# Patient Record
Sex: Female | Born: 1965 | Race: Black or African American | Hispanic: No | Marital: Single | Smoking: Never smoker
Health system: Southern US, Community
[De-identification: ages and names within clinical notes are randomized; demographics above are authoritative.]

## PROBLEM LIST (undated history)

## (undated) DIAGNOSIS — M1990 Unspecified osteoarthritis, unspecified site: Secondary | ICD-10-CM

## (undated) HISTORY — PX: INCISION AND DRAINAGE: SHX5863

---

## 1998-05-31 ENCOUNTER — Emergency Department (HOSPITAL_COMMUNITY): Admission: EM | Admit: 1998-05-31 | Discharge: 1998-05-31 | Payer: Self-pay | Admitting: Emergency Medicine

## 1998-06-11 ENCOUNTER — Emergency Department (HOSPITAL_COMMUNITY): Admission: EM | Admit: 1998-06-11 | Discharge: 1998-06-11 | Payer: Self-pay | Admitting: Emergency Medicine

## 2000-03-19 ENCOUNTER — Emergency Department (HOSPITAL_COMMUNITY): Admission: EM | Admit: 2000-03-19 | Discharge: 2000-03-20 | Payer: Self-pay | Admitting: Emergency Medicine

## 2000-03-19 ENCOUNTER — Encounter: Payer: Self-pay | Admitting: Emergency Medicine

## 2000-05-22 ENCOUNTER — Emergency Department (HOSPITAL_COMMUNITY): Admission: EM | Admit: 2000-05-22 | Discharge: 2000-05-22 | Payer: Self-pay | Admitting: Emergency Medicine

## 2001-04-12 ENCOUNTER — Inpatient Hospital Stay (HOSPITAL_COMMUNITY): Admission: EM | Admit: 2001-04-12 | Discharge: 2001-05-02 | Payer: Self-pay | Admitting: Emergency Medicine

## 2001-04-12 ENCOUNTER — Encounter: Payer: Self-pay | Admitting: *Deleted

## 2001-04-13 ENCOUNTER — Encounter: Payer: Self-pay | Admitting: Orthopedic Surgery

## 2001-04-15 ENCOUNTER — Encounter: Payer: Self-pay | Admitting: *Deleted

## 2001-04-17 ENCOUNTER — Encounter: Payer: Self-pay | Admitting: *Deleted

## 2001-04-18 ENCOUNTER — Encounter: Payer: Self-pay | Admitting: Infectious Diseases

## 2001-04-21 ENCOUNTER — Encounter: Payer: Self-pay | Admitting: Emergency Medicine

## 2001-04-22 ENCOUNTER — Encounter: Payer: Self-pay | Admitting: *Deleted

## 2001-04-22 ENCOUNTER — Encounter: Payer: Self-pay | Admitting: Family Medicine

## 2001-04-25 ENCOUNTER — Encounter: Payer: Self-pay | Admitting: Rheumatology

## 2001-05-02 ENCOUNTER — Inpatient Hospital Stay (HOSPITAL_COMMUNITY)
Admission: RE | Admit: 2001-05-02 | Discharge: 2001-06-26 | Payer: Self-pay | Admitting: Physical Medicine & Rehabilitation

## 2001-05-16 ENCOUNTER — Encounter: Payer: Self-pay | Admitting: Physical Medicine & Rehabilitation

## 2001-08-14 ENCOUNTER — Ambulatory Visit (HOSPITAL_COMMUNITY)
Admission: RE | Admit: 2001-08-14 | Discharge: 2001-08-14 | Payer: Self-pay | Admitting: Physical Medicine & Rehabilitation

## 2001-08-14 ENCOUNTER — Encounter: Payer: Self-pay | Admitting: Physical Medicine & Rehabilitation

## 2003-02-20 ENCOUNTER — Encounter: Payer: Self-pay | Admitting: Orthopedic Surgery

## 2003-02-20 ENCOUNTER — Ambulatory Visit: Admission: RE | Admit: 2003-02-20 | Discharge: 2003-02-20 | Payer: Self-pay | Admitting: Orthopedic Surgery

## 2003-02-27 ENCOUNTER — Encounter: Payer: Self-pay | Admitting: Internal Medicine

## 2003-02-27 ENCOUNTER — Encounter: Admission: RE | Admit: 2003-02-27 | Discharge: 2003-02-27 | Payer: Self-pay | Admitting: Internal Medicine

## 2003-03-25 ENCOUNTER — Ambulatory Visit (HOSPITAL_COMMUNITY): Admission: RE | Admit: 2003-03-25 | Discharge: 2003-03-25 | Payer: Self-pay | Admitting: *Deleted

## 2003-03-25 ENCOUNTER — Encounter (INDEPENDENT_AMBULATORY_CARE_PROVIDER_SITE_OTHER): Payer: Self-pay | Admitting: *Deleted

## 2003-03-25 ENCOUNTER — Encounter: Payer: Self-pay | Admitting: Internal Medicine

## 2003-04-17 ENCOUNTER — Ambulatory Visit (HOSPITAL_COMMUNITY): Admission: RE | Admit: 2003-04-17 | Discharge: 2003-04-17 | Payer: Self-pay | Admitting: Internal Medicine

## 2003-04-17 ENCOUNTER — Encounter: Payer: Self-pay | Admitting: Cardiology

## 2003-04-17 ENCOUNTER — Encounter: Payer: Self-pay | Admitting: Internal Medicine

## 2003-04-17 ENCOUNTER — Encounter (INDEPENDENT_AMBULATORY_CARE_PROVIDER_SITE_OTHER): Payer: Self-pay | Admitting: *Deleted

## 2003-05-04 ENCOUNTER — Encounter: Admission: RE | Admit: 2003-05-04 | Discharge: 2003-05-04 | Payer: Self-pay | Admitting: Rheumatology

## 2003-05-04 ENCOUNTER — Encounter: Payer: Self-pay | Admitting: Rheumatology

## 2003-06-16 ENCOUNTER — Encounter: Admission: RE | Admit: 2003-06-16 | Discharge: 2003-06-16 | Payer: Self-pay | Admitting: Rheumatology

## 2003-06-16 ENCOUNTER — Encounter: Payer: Self-pay | Admitting: Rheumatology

## 2003-09-02 ENCOUNTER — Encounter: Payer: Self-pay | Admitting: Internal Medicine

## 2003-09-02 ENCOUNTER — Encounter: Admission: RE | Admit: 2003-09-02 | Discharge: 2003-09-02 | Payer: Self-pay | Admitting: Internal Medicine

## 2003-09-07 ENCOUNTER — Ambulatory Visit (HOSPITAL_COMMUNITY): Admission: RE | Admit: 2003-09-07 | Discharge: 2003-09-07 | Payer: Self-pay | Admitting: Internal Medicine

## 2003-09-07 ENCOUNTER — Encounter (INDEPENDENT_AMBULATORY_CARE_PROVIDER_SITE_OTHER): Payer: Self-pay | Admitting: *Deleted

## 2003-09-07 ENCOUNTER — Encounter: Payer: Self-pay | Admitting: Internal Medicine

## 2003-09-11 ENCOUNTER — Inpatient Hospital Stay (HOSPITAL_COMMUNITY): Admission: EM | Admit: 2003-09-11 | Discharge: 2003-09-22 | Payer: Self-pay | Admitting: Internal Medicine

## 2003-09-11 ENCOUNTER — Encounter: Payer: Self-pay | Admitting: Internal Medicine

## 2003-09-13 ENCOUNTER — Encounter: Payer: Self-pay | Admitting: Thoracic Surgery

## 2003-09-14 ENCOUNTER — Encounter (INDEPENDENT_AMBULATORY_CARE_PROVIDER_SITE_OTHER): Payer: Self-pay | Admitting: Specialist

## 2003-09-14 ENCOUNTER — Encounter: Payer: Self-pay | Admitting: Thoracic Surgery

## 2003-09-15 ENCOUNTER — Encounter: Payer: Self-pay | Admitting: Thoracic Surgery

## 2003-09-16 ENCOUNTER — Encounter: Payer: Self-pay | Admitting: Thoracic Surgery

## 2003-09-17 ENCOUNTER — Encounter: Payer: Self-pay | Admitting: Thoracic Surgery

## 2003-09-18 ENCOUNTER — Encounter: Payer: Self-pay | Admitting: Thoracic Surgery

## 2003-09-19 ENCOUNTER — Encounter: Payer: Self-pay | Admitting: Thoracic Surgery

## 2003-09-20 ENCOUNTER — Encounter: Payer: Self-pay | Admitting: Thoracic Surgery

## 2003-09-21 ENCOUNTER — Encounter: Payer: Self-pay | Admitting: Thoracic Surgery

## 2003-10-06 ENCOUNTER — Encounter: Admission: RE | Admit: 2003-10-06 | Discharge: 2003-10-06 | Payer: Self-pay | Admitting: Thoracic Surgery

## 2003-10-20 ENCOUNTER — Encounter: Admission: RE | Admit: 2003-10-20 | Discharge: 2003-10-20 | Payer: Self-pay | Admitting: Thoracic Surgery

## 2003-10-26 ENCOUNTER — Emergency Department (HOSPITAL_COMMUNITY): Admission: EM | Admit: 2003-10-26 | Discharge: 2003-10-27 | Payer: Self-pay | Admitting: Emergency Medicine

## 2003-11-05 ENCOUNTER — Encounter: Admission: RE | Admit: 2003-11-05 | Discharge: 2003-11-05 | Payer: Self-pay | Admitting: Thoracic Surgery

## 2003-12-01 ENCOUNTER — Encounter: Admission: RE | Admit: 2003-12-01 | Discharge: 2003-12-01 | Payer: Self-pay | Admitting: Thoracic Surgery

## 2003-12-23 ENCOUNTER — Encounter: Admission: RE | Admit: 2003-12-23 | Discharge: 2003-12-23 | Payer: Self-pay | Admitting: Thoracic Surgery

## 2004-02-23 ENCOUNTER — Encounter: Admission: RE | Admit: 2004-02-23 | Discharge: 2004-02-23 | Payer: Self-pay | Admitting: Thoracic Surgery

## 2004-10-03 ENCOUNTER — Ambulatory Visit: Payer: Self-pay | Admitting: Physical Medicine & Rehabilitation

## 2004-10-03 ENCOUNTER — Inpatient Hospital Stay (HOSPITAL_COMMUNITY): Admission: RE | Admit: 2004-10-03 | Discharge: 2004-10-06 | Payer: Self-pay | Admitting: Orthopedic Surgery

## 2004-11-02 ENCOUNTER — Encounter: Admission: RE | Admit: 2004-11-02 | Discharge: 2005-01-31 | Payer: Self-pay | Admitting: Orthopedic Surgery

## 2005-05-27 ENCOUNTER — Emergency Department (HOSPITAL_COMMUNITY): Admission: EM | Admit: 2005-05-27 | Discharge: 2005-05-27 | Payer: Self-pay | Admitting: Emergency Medicine

## 2005-07-20 ENCOUNTER — Emergency Department (HOSPITAL_COMMUNITY): Admission: EM | Admit: 2005-07-20 | Discharge: 2005-07-20 | Payer: Self-pay | Admitting: Emergency Medicine

## 2005-07-24 ENCOUNTER — Emergency Department (HOSPITAL_COMMUNITY): Admission: EM | Admit: 2005-07-24 | Discharge: 2005-07-24 | Payer: Self-pay | Admitting: Emergency Medicine

## 2005-07-26 ENCOUNTER — Emergency Department (HOSPITAL_COMMUNITY): Admission: EM | Admit: 2005-07-26 | Discharge: 2005-07-26 | Payer: Self-pay | Admitting: Family Medicine

## 2005-12-14 IMAGING — CR DG CHEST 2V
2 series · 2 of 2 positions shown · non-contrast
Comparison: none

CLINICAL DATA: Empyema with fistula.
 TWO VIEW CHEST
 Compared to 12/23/03.
 Heart and mediastinum remain normal.  Small bilateral pleural effusions with slight interval decrease.  No definite air space disease.
 IMPRESSION
 Small bilateral pleural effusions with interval decrease.  Otherwise unchanged.

[view not recorded (1 of 2)]
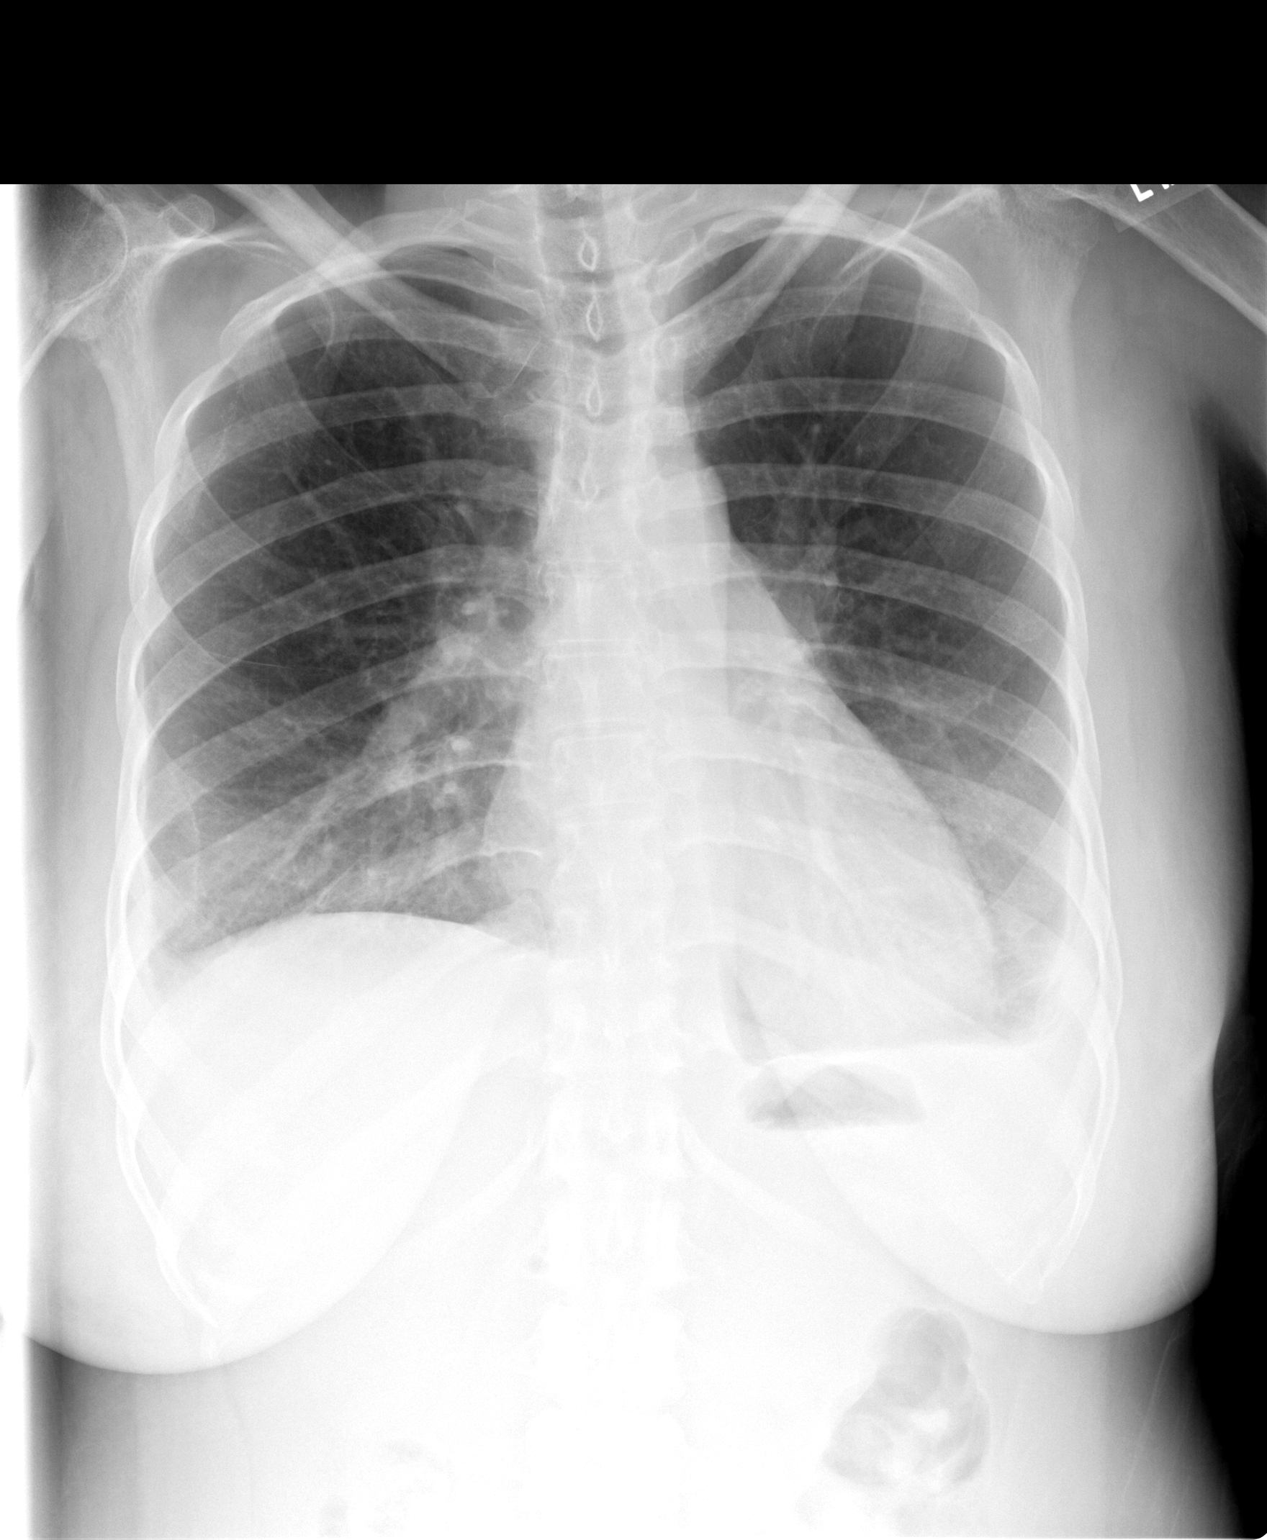

[view not recorded (2 of 2)]
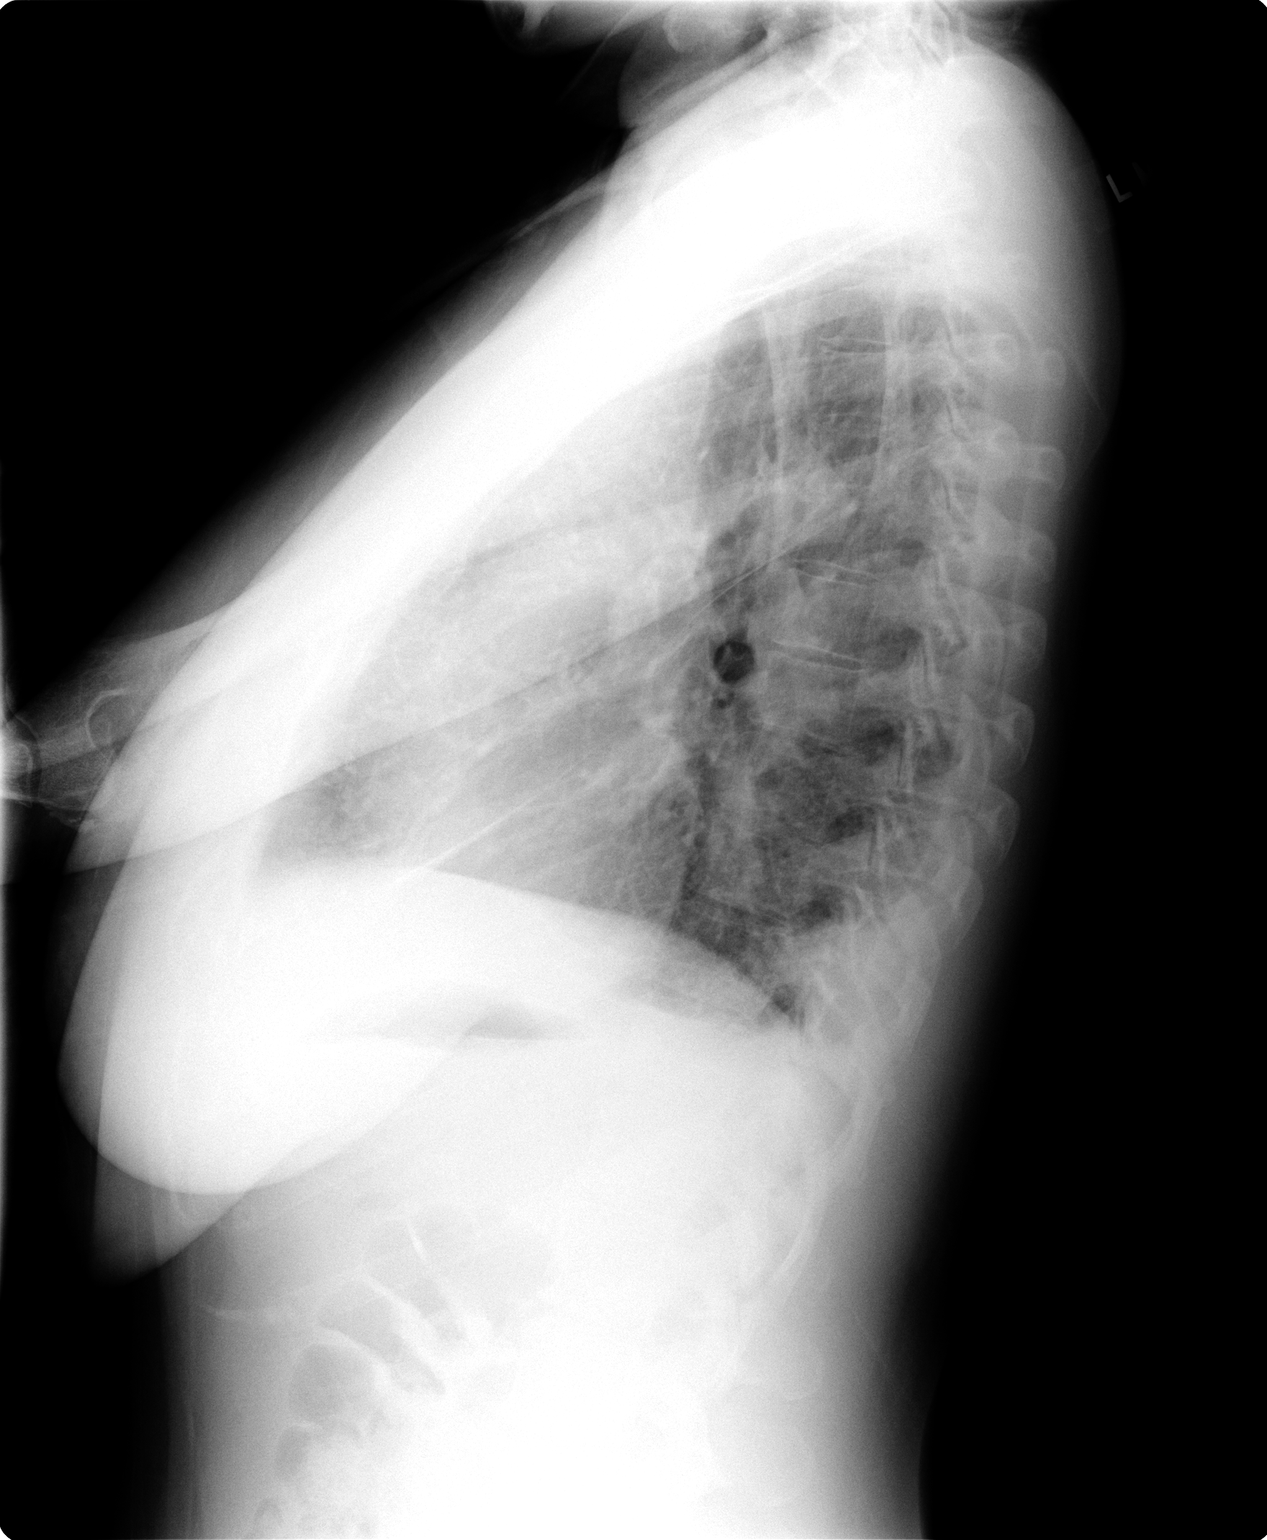

[2 of 2 positions shown; findings below may reference images not displayed]

## 2006-03-01 ENCOUNTER — Emergency Department (HOSPITAL_COMMUNITY): Admission: EM | Admit: 2006-03-01 | Discharge: 2006-03-01 | Payer: Self-pay | Admitting: Emergency Medicine

## 2006-05-24 ENCOUNTER — Encounter (INDEPENDENT_AMBULATORY_CARE_PROVIDER_SITE_OTHER): Payer: Self-pay | Admitting: *Deleted

## 2006-05-24 ENCOUNTER — Ambulatory Visit (HOSPITAL_COMMUNITY): Admission: RE | Admit: 2006-05-24 | Discharge: 2006-05-24 | Payer: Self-pay | Admitting: Obstetrics & Gynecology

## 2007-03-15 ENCOUNTER — Emergency Department (HOSPITAL_COMMUNITY): Admission: EM | Admit: 2007-03-15 | Discharge: 2007-03-15 | Payer: Self-pay | Admitting: Emergency Medicine

## 2010-12-17 ENCOUNTER — Encounter: Payer: Self-pay | Admitting: Thoracic Surgery

## 2010-12-19 ENCOUNTER — Encounter: Payer: Self-pay | Admitting: Obstetrics & Gynecology

## 2011-04-14 NOTE — H&P (Signed)
Kimberly Chavez, Kimberly Chavez                      ACCOUNT NO.:  [id number]   MEDICAL RECORD NO.:  [id number]                   PATIENT TYPE:  INP   LOCATION:  NA                                   FACILITY:  [facility]   PHYSICIAN:  Mila Homer. Sherlean Foot, M.D.              DATE OF BIRTH:  [birth year]   DATE OF ADMISSION:  02/23/2003  DATE OF DISCHARGE:                                HISTORY & PHYSICAL   CHIEF COMPLAINT:  Stiff and painful right knee.   HISTORY OF PRESENT ILLNESS:  The patient is a 45 year old black female with  a diagnosis of rheumatoid arthritis.  The patient states that approximately  three to four years ago, she started having progressively worsening  stiffness of her right knee. She would have some discomfort in her knee with  weightbearing, but her primary problem was stiffness.  The stiffness  progressed over time, and currently she is unable to bend her knee at all.  The knee is locked in a full extension position. The patient currently  denies any mechanical symptoms due to inability to move her knee.  She does  still have pain with weightbearing.  She does have occasional swelling.   X-rays revealed complete bone-on-bone and joint destruction of her right  knee.   ALLERGIES:  No known drug allergies   CURRENT MEDICATIONS:  Celebrex 200 mg p.o. daily.   PAST MEDICAL HISTORY:  1. Rheumatoid arthritis diagnosed approximately two years ago.  2. Significant multiple staph abscesses upper and lower extremities in 2002     requiring surgical debridement.   The patient denies any other significant medical issues, but she is not  currently being followed by any primary care or specialty physician other  than Dr. Sherlean Foot.   PAST SURGICAL HISTORY:  1. Multiple staph abscesses, left arm, right leg, and left buttocks in 2002.  2. Miscarriage D&C in 1998.   The patient denies any complications with the above-listed surgical  procedures.   SOCIAL HISTORY:  The patient is a  45 year old black female. She does have  significant external physical abnormalities correlating with rheumatoid  arthritis diagnosis.  She appears to be weight appropriate for her size and  age.  She denies any history of smoking or alcohol use.  She is single.  She  lives in a [place] house.  She is currently disabled due to her right  knee.   PRIMARY CARE PHYSICIAN:  The patient has not been followed by any primary  care physicians in the past.   FAMILY MEDICAL HISTORY:  Mother is deceased from unknown cancer.  Father is  deceased from a gunshot wound.  The patient has three brothers alive and in  good health, two sisters alive in good health.   REVIEW OF SYSTEMS:  The patient denies any complaints other than those  related to this current surgical or rheumatoid knee, specifically within the  general, sensory, respiratory, cardiac, GI,  GU, hematologic,  musculoskeletal, neurologic, mental status issues.   PHYSICAL EXAMINATION:  VITAL SIGNS:  Height is 5 feet even.  Weight 145  pounds.  Pulse 120 and regular, respirations 12, blood pressure 198/68.  SKIN:  Warm and dry.  GENERAL: This is an age-appropriate-appearing black female with obvious  upper and lower extremity deformities related to rheumatoid arthritis,  ambulates with cane in her right arm.  She ambulates with her right leg  locked in full extension position. She has difficulty getting onto the exam  table even with assistance.  HEENT:  Head is normocephalic and atraumatic.  Nontender over maxillary and  frontal sinuses.  Pupils are equal, round, and reactive.  Pupils appear to  be slightly exophthalmic.  Extraocular movements are intact.  Sclerae is not  icteric. Conjunctivae pink and moist.  External ears without deformity.  Canals patent.  TMs pearly grey and intact.  Gross hearing is intact.  Nasal  septum is midline.  Mucous membranes pink and moist.  Oral buccal mucosa  pink and moist without lesions.  Dentition  was in fair repair.  The patient  is able to swallow without any difficulty.  The patient did have diffuse  erythremic macular rash across the predominantly right side of lower face.  NECK:  Supple.  No palpable lymphadenopathy.  Thyroid gland nontender.  The  patient had good range of motion of her cervical spine without any  difficulty or tenderness.  CHEST:  Lung exam clear and equal bilaterally.  No wheezes, rales, rhonchi,  or rubs noted.  HEART:  Rapid, regular rate and rhythm.  S1 and S2 auscultated.  No murmurs,  rubs, or gallops noted.  ABDOMEN:  Flat, soft, nontender.  Bowel sounds normoactive throughout.  Unable to palpate any hepatosplenomegaly.  CVA region nontender.  UPPER EXTREMITIES:  Right and left shoulder were significantly limited in  range of motion.  The patient was unable to elevate her arms above 45  degrees in any direction, both right and left.  Her right elbow was locked  in about a 45 degree flexion.  Her left elbow had 10 degrees short of full  extension, flexion up to 130 degrees with approximately 45 degrees pronation  and supination from a neutral position.  Bilateral hands had diffuse  swelling and bogginess appearing about all digits.  She had moderate amount  of diffuse erythema around the palmar aspects of the digits and around the  fingernail creases.  She was unable to fully extend all of her digits, but  she was able to flex approximately about three-quarters of the way.  RIGHT AND LEFT HIP:  Full extension and flexion up to about 90 degrees with  about 15 degrees internal and external rotation without any discomfort.  Right knee had a very obvious high and prominent patellar and femoral  condyle region.  It was mechanically locked in full extension.  It was  painful with any attempts for flexion, and there was absolutely no movement anterior posteriorly, medially, laterally, or with flexion.  The left knee  had full extension and flexion back to 90  degrees.  She had no general  tenderness throughout, but it was boggy-like appearing.  She had about 5 to  10 degrees valgus varus laxity.  Calves were nontender.  The ankles had  fairly good dorsiflexion and plantar flexion.  PERIPHERAL VASCULATURE:  Carotid pulses 2+, no bruits.  Radial pulses were  1+, unable to palpate any posterior tibial. Dorsalis pedis pulses were  1+.  She had some swelling in the left ankle that was nonpitting, minimal  swelling in the right ankle.  She had no venostasis changes or varicosities.  NEUROLOGIC:  The patient was conscious, alert, and appropriate, held an easy  conversation with examiner. Cranial nerves II-XII grossly intact.  She was  grossly intact to light touch sensation from head to toe.  Unable to elicit  deep tendon reflexes on the right extremities due to deformities.  BREASTS/RECTAL/GU:  Exams deferred at this time.   IMPRESSION:  1. Severe rheumatologic deformities of the right upper and lower     extremities.  2. Rheumatoid arthritis.  3. History of multiple Staphylococcus abscesses surgically removed in 2002.   PLAN:  The patient is expected to be admitted to [facility] on  February 23, 2003, under the care of Mila Homer. Lucey, M.D., for planned right  total knee arthroplasty of significant complication due to her rheumatoid  arthritis and locked, fully extended knee.  Due to the patient's current  medical status, her rapid heart rate, blood pressure, her not being followed  by any primary care physician for any significant period of time, advanced  age, rheumatoid arthritis, previous staph infections, the patient will be  set up and evaluated for medical clearance prior to this procedure.  If we  do not receive medical clearance prior to this or extensive workup is  required, the patient will be cancelled and rescheduled when medically  appropriate. Once approved for surgery, the patient will undergo all the  routine labs and  tests prior to this procedure.  The patient is expected to  be a slow progress candidate due to her significant stages of rheumatoid  arthritis and weakness of her right lower extremity.     Jamelle Rushing, P.A.                      Mila Homer. Sherlean Foot, M.D.    RWK/MEDQ  D:  02/17/2003  T:  02/17/2003  Job:  [id number]

## 2011-04-14 NOTE — Op Note (Signed)
Kimberly Chavez, Kimberly Chavez                      ACCOUNT NO.:  [id number]   MEDICAL RECORD NO.:  [id number]                   PATIENT TYPE:  OIB   LOCATION:  2854                                 FACILITY:  [facility]   PHYSICIAN:  Ines Bloomer, M.D.              DATE OF BIRTH:  1966-01-13   DATE OF PROCEDURE:  09/14/2003  DATE OF DISCHARGE:                                 OPERATIVE REPORT   PREOPERATIVE DIAGNOSIS:  Empyema, right chest.   POSTOPERATIVE DIAGNOSES:  1. Chronic empyema right chest.  2. Polyarthritis.  3. Possible lupus with immunosuppression.   OPERATION:  Attempted right VATS, right thoracotomy. Drainage of empyema  with decortication.   SURGEON:  Ines Bloomer, M.D.   FIRST ASSISTANT:  Coral Ceo, P.A.-C.   ANESTHESIA:  General endotracheal anesthesia.   DESCRIPTION OF PROCEDURE:  After percutaneous insertion of all monitor lines  the patient underwent general anesthesia and was turned to the right lateral  thoracotomy position. This patient had had multiple thoracenteses of a right  pleural effusion and had cultured out Staph. A CT scan showed a thickened  pleura and empyema was suspected. She was brought to the operating room for  drainage. She was on chronic immunosuppression with prednisone and Imuran  for polyarthritis. She was also suspected to have systemic lupus  erythematosus.   After prepping and draping of the right chest, 2 trocar sites were made in  the 8th intercostal space. When we got there it was unfortunately at the  diaphragm and we could not really get into the empyema spaces, so this was  closed and another incision was made at the 7th intercostal space at the mid  axillary line. This did go into the empyema cavity and this was evacuated  and sent for culture and exudate was sent for culture.   The patient had a chronic empyema cavity and for this reason a  posterolateral thoracotomy was made over the 5th intercostal space. The  latissimus was divided and the serratus was reflected anteriorly. The 5th  intercostal space was entered. The superior  portion of the 5th intercostal  space was the upper  portion of the empyema. The pleura was taken off the  chest wall doing an extra pleural resection and dissecting down, getting  into the right upper lobe. The right upper lobe was then dissected off the  chest wall with sharp dissection. This was dissected superiorly from the  posterior segment of the right upper lobe to the anterior segment of the  right upper lobe.   The empyema cavity was then dissected medially, dissecting off the  mediastinum and the pericardium, and then dissecting down to the diaphragm  and dissecting off the diaphragm. Then attention was turned to the superior  portion of the empyema cavity and dissection was carried down to the  thickened peel to the lung and then the thickened peel was dissected off  with  blunt and sharp dissection using Kitners to remove it. We first removed  it along the fissure and removed it off the upper lobe and then removed it  off the superior  segment of the lower lobe.   When we came down to the minor fissure, the right middle lobe was completely  encapsulated and the thickened peel was dissected off the latter fissure,  freeing up the right upper lobe and then dissecting off the major fissure,  and finally the peel was dissected off the right middle lobe, completely  freeing it up.   Attention was turned the basilar segments of the right lower lobe and the  peel was dissected off the right basilar segment, dissecting down into  costophrenic angle. There was a thickened peel in the costophrenic angle and  this parietal peel was dissected off the chest wall.   After this had been done and all the peel had been removed, the lung was  reexpanded. There was an area of tear in the lung that was sutured in the  right upper lobe with 3-0 Vicryl in an interrupted  fashion. An area of the  basilar segment of the right upper lobe where there was a tear was resected  with the EZ 45 stapler, and a smaller area in the upper lobe was also  resected with the EZ 45 stapler.   A stab wound was made posteriorly  and a posterior  chest tube was brought  in and tied in place with 0 silk. In the area where we had done the original  trocar site in the 7th intercostal space, a right-angle and a straight chest  tube were placed through the patient's chest cavity and sutured in place  with 0 silk.   A Marcaine block was done in the usual fashion. The chest was closed with 3  pericostals of #1 Vicryl in the muscle layer. An on cue was placed in the  usual fashion, then 2-0 Vicryl in the subcutaneous tissue and Ethicon skin  clips. The patient was returned to the recovery room in serious condition                                                 Ines Bloomer, M.D.    DPB/MEDQ  D:  09/14/2003  T:  09/14/2003  Job:  [id number]   cc:   Joni Fears D. Young, M.D.  [address] [place]  [place] [address]  Fax: [phone]

## 2011-04-14 NOTE — Discharge Summary (Signed)
Kimberly Chavez, Kimberly Chavez NO.:  [id number]   MEDICAL RECORD NO.:  [id number]          PATIENT TYPE:  INP   LOCATION:  0452                         FACILITY:  [facility]   PHYSICIAN:  Ollen Gross, M.D.    DATE OF BIRTH:  [birth year]   DATE OF ADMISSION:  10/03/2004  DATE OF DISCHARGE:  10/06/2004                                 DISCHARGE SUMMARY   ADMITTING DIAGNOSES:  1.  End-stage arthritis, right knee.  2.  History of lupus.  3.  History of staphylococcal empyema of chest.   DISCHARGE DIAGNOSES:  1.  Rheumatoid arthritis right knee status post right total knee      arthroplasty.  2.  Mild postoperative blood loss anemia, did not require transfusion.  3.  Mild postoperative hyponatremia, improving at time of discharge.  4.  History of lupus.  5.  History of staphylococcal empyema of chest.   PROCEDURE:  Date of surgery October 03, 2004 - right total knee  arthroplasty.  Surgeon:  Dr. Homero Fellers Aluisio.  Assistant:  Avel Peace, P.A.-  C.  Anesthesia:  General.  Postoperative Marcaine pain pump.  Minimal blood  loss.  Hemovac drain x1.  Tourniquet time 65 minutes at 300 mmHg.   CONSULTS:  Rehab services.   BRIEF HISTORY:  Kimberly Chavez is a 45 year old female with severe rheumatoid  arthritis gone on to develop end-stage arthritis.  Radiographically she is  not fused, but clinically she is fused about 10 degrees and cannot move the  knee, intractable pain, and now presents for total knee arthroplasty.   LABORATORY DATA:  CBC:  Hemoglobin preoperative 12.4, hematocrit of 38.2,  white cell count 5.3, differential normal.  Postoperative H&H 9.9 and 29.8.  Last noted H&H 9.2 and 27.7.  PT/PTT preoperatively 14.3 and 37 respectively  with an INR of 1.2.  Serial protimes followed.  Last noted PT/INR 22.7 and  2.6.  Chem panel on admission:  Slightly elevated total protein of 9.1;  remaining chem panel within normal limits.  Serial BMETs are followed.  Sodium did drop from 139  to 126, was back up to 127.  Chloride dropped from  107 down to 94, last noted at 93.  Glucose went up from 83 to 151, back down  to 120.  Urinalysis:  Trace leukocyte esterase with many epithelial cells;  however, only 0-2 white cells, few bacteria.  Blood group and type O  positive.  EKG dated September 14, 2003:  Sinus tachycardia; since previous  tracing anterior T waves changed, improved.  Confirmed by Dr. Viann Fish, Jr.  Repeat EKG September 27, 2004:  Normal sinus rhythm, nonspecific  ST abnormality.  Heart rate decreased since previous tracing, from Dr. Olga Millers.   HOSPITAL COURSE:  The patient admitted to [facility], taken to the  OR, underwent the above-stated procedure without complication.  The patient  tolerated the procedure well and later transferred to the recovery room and  then to the orthopedic floor to continue postoperative care.  Vital signs  followed.  The patient had good pain control on postoperative day #1.  The  IV was Hep-Locked.  She had a Marcaine pain pump.  Started getting up with  physical therapy by day #2.  The knee was feeling a little bit better.  She  did have some asymptomatic tachycardia.  Denied any chest pain, shortness of  breath. She did undergo a rehab consult.  She was seen by rehab services and  felt that she would be an appropriate candidate for inpatient rehab.  Dressings changes initiated on postoperative day #2.  Incision was healing  well and the pain pump drain was removed, PCA and IV's were discontinued  along with the Foley.  Started getting up a little bit better and started  progressive therapy.  By day #3 she was feeling much better.  She was  progressing well with her physical therapy.  She did have a little bit of  elevated temperature on the evening of postoperative day #2 but she was back  afebrile by the morning of day #3.  Her hemoglobin was stable, she was  asymptomatic.  Sodium was a little low but had  started to trend upward.  She  was progressing well and decided that she would want to go home.  Therefore,  rehab was cancelled.  She worked with therapy, was doing well, and was  discharged home later that day.   DISCHARGE PLANNING:  1.  The patient was discharged home on October 06, 2004.  2.  Discharge diagnoses:  Please see above.  3.  Discharge medications:  Percocet, Robaxin, Coumadin.  4.  Diet:  Regular diet.  5.  Activity:  Total knee protocol, weightbearing as tolerated right lower      extremity, gait training ambulation and ADLs, home health PT and home      health nursing.  6.  Follow-up:  Two weeks from surgery.   DISPOSITION:  Home.   CONDITION UPON DISCHARGE:  Improved.     Alex   ALP/MEDQ  D:  11/23/2004  T:  11/23/2004  Job:  [id number]

## 2011-04-14 NOTE — H&P (Signed)
Kimberly, Chavez NO.:  [id number]   MEDICAL RECORD NO.:  [id number]          PATIENT TYPE:  INP   LOCATION:  0452                         [facility]ACILITY:  [facility]   PHYSICIAN:  Ollen Gross, M.D.    DATE OF BIRTH:  1966-03-15   DATE OF ADMISSION:  10/03/2004  DATE OF DISCHARGE:  10/06/2004                                HISTORY & PHYSICAL   CHIEF COMPLAINT:  Right knee pain.   HISTORY OF PRESENT ILLNESS:  The patient is a 46 year old female with a  longstanding history of discomfort concerning her right knee which has  progressively gotten worse over the past several months.  She has seen Dr.  Lequita Halt and has had injections in the past.  She was told that sometimes she  would need to have a knee replacement.  She ended up developing a pleural  effusion and could not have the procedure done a while back.  Unfortunately,  she has gone on to develop essentially a fused knee with progressive pain.  She has had a very difficult time getting around.  She is seen in the office  by Dr. Lequita Halt, found to have severe end-stage arthritis in the right knee  with bone-on-bone and notes joint space at all in the knee essentially  almost fused to the point where she has end-stage arthritis with a near  solid fusion.  She is having significant pain.  It is felt she would benefit  from having a takedown of this arthritis infusion and having placement of a  right total knee.  Risks and benefits have been discussed with the patient,  and she elects to proceed with surgery.   ALLERGIES:  No known drug allergies.   CURRENT MEDICATIONS:  Prednisone 5 mg.   PAST MEDICAL HISTORY:  1.  Lupus.  2.  History of a Staphylococcus empyema of the chest back on October 2004.   PAST SURGICAL HISTORY:  1.  I&D of the left hip and right calf in 2001.  2.  Chest tubes with drainage of empyema in 2004.   SOCIAL HISTORY:  Single, disabled, nonsmoker, no alcohol, no children.   FAMILY HISTORY:   Father deceased secondary to a gunshot wound.  Mother  deceased age 77 with a history of cancer.   REVIEW OF SYSTEMS:  GENERAL:  No fevers, chills, or night sweats.  NEUROLOGIC:  No seizures, syncope, paralysis.  RESPIRATORY:  No shortness of  breath, productive cough, or hemoptysis.  CARDIOVASCULAR:  No chest pain,  angina, or orthopnea.  GI:  No nausea, vomiting, diarrhea, or constipation.  GU:  No dysuria, hematuria, or discharge.  MUSCULOSKELETAL:  Pertinent to  the knee found in the history of present illness.   PHYSICAL EXAMINATION:  VITAL SIGNS:  Pulse 76, respirations 14, blood  pressure 120/82.  GENERAL:  A 45 year old African-American female, well-nourished, well-  developed, in no acute distress.  Alert, oriented, and cooperative, very  pleasant.  HEENT:  Normocephalic, atraumatic.  Pupils are round and reactive.  Ears  clear.  EOMs are intact.  NECK:  Supple.  No carotid bruits.  CHEST:  Clear  anterior, posterior chest walls.  HEART:  Regular rate and rhythm.  No murmurs.  S1, S2 noted.  ABDOMEN:  Soft, nontender.  Bowel sounds present.  RECTAL/BREASTS/GENITALIA:  Not done.  Not pertinent to present illness.  EXTREMITIES:  Right knee, essentially fused in full extension.  No flexion,  no instability problems.  Pulse is noted to be intact.   IMPRESSION:  1.  End-stage arthritis, right knee.  2.  History of lupus.  3.  History of Staphylococcus empyema of the chest.   PLAN:  The patient will be admitted to [facility] to undergo a  right total knee arthroplasty.  The surgery will be performed by Dr.  Lequita Halt.     Alex   ALP/MEDQ  D:  10/12/2004  T:  10/13/2004  Job:  [id number]   cc:   Aundra Dubin, M.D.

## 2011-04-14 NOTE — H&P (Signed)
[facility]  Patient:    Kimberly Chavez, Kimberly Chavez                   MRN: [id number] Adm. Date:  [id number] Attending:  Heather Roberts                         History and Physical  CHIEF COMPLAINT:  Pain and swelling.  HISTORY OF PRESENT ILLNESS:  Kimberly Chavez is a 45 year old black single female, currently seeking disability for disabling rheumatoid arthritis, on prednisone 5 mg b.i.d. since summer 2001, with an eight-month history of arthralgias and joint stiffness and intermittent right knee swelling, now with one week of right knee swelling persistent, associated with arthralgias and stiffness, one day of right shoulder stiffness and increased pain and swelling of her arms associated with erythema, and swelling of her right calf.  SOCIAL HISTORY:  Lives with her sister.  Siblings and father nearby.  She is currently unemployed.  MEDICATIONS: 1. Prednisone 5 mg b.i.d. 2. Aleve.  PAST MEDICAL HISTORY:  July 2001, DX rheumatoid arthritis, followed previously by rheumatologist.  FAMILY HISTORY:  Mother deceased -- lung sarcoma.  Three brothers and two sisters alive and well.  Father -- no health problems.  Specifically denied family history of cardiac, diabetes, thyroid.  REVIEW OF SYSTEMS:  HEENT:  No current allergies.  CARDIAC:  No chest pain. PULMONARY:  No current cough, shortness of breath.  GI:  No indigestion.  PHYSICAL EXAMINATION:  GENERAL:  Physical exam reveals a slender black female, lying, with some discomfort with any movement.  VITAL SIGNS:  Blood pressure 105/52.  Pulse is 120 and regular.  Respiratory rate 24.  Temperature 97.  HEENT:  Fundi grossly normal.  Pharynx not injected.  TMs:  Fluid.  NECK:  No thyromegaly.  CARDIAC:  Tachycardia.  Soft systolic murmur.  LUNGS:  Clear to A&P.  ABDOMEN:  Soft.  Spleen and liver are not felt.  RECTAL:  Deferred.  PELVIC:  Deferred.  EXTREMITIES:  Remarkable for swelling of her  right shoulder, bilateral forearm swelling and nodules with erythema just proximal to the left wrist.  She has swelling of her right knee, with swelling of the calf probably related to the knee.  LABORATORY DATA:  Hemoglobin 9.1, WBC 17,000.  IMPRESSION: 1. Flare of rheumatoid arthritis with nodules and arthralgias. 2. Probable Bakers cyst.  PLAN:  Physical therapy and rheumatology consult if available.  Higher doses of prednisone. D:  04/12/01 TD:  04/14/01 Job: [id number]

## 2011-04-14 NOTE — Op Note (Signed)
[facility]  Patient:    Kimberly Chavez, Kimberly Chavez                   MRN: [id number] Proc. Date: 04/13/01 Adm. Date:  [id number] Attending:  Heather Roberts CC:         Heather Roberts, M.D.   Operative Report  PREOPERATIVE DIAGNOSIS:       Abscesses left wrist, forearm, and arm.  POSTOPERATIVE DIAGNOSIS:      Abscesses left wrist, forearm, and arm.  OPERATION:                    Incision and drainage left wrist, left forearm,                               and left upper arm.  SURGEON:                      Lowell Bouton, M.D.  ASSISTANT:  ANESTHESIA:                   General anesthesia.  OPERATIVE FINDINGS:           The patient had gross purulent material in the subcutaneous tissues over the dorsum of her left wrist into her left forearm and into the upper arm around the biceps.  The abscess extended up into the axilla onto the left.  There was approximately 100 cc of purulent material total.  There was necrosis of one of the extensor tendons in the forearm and significant amount of subcutaneous fat necrosis.  DESCRIPTION OF PROCEDURE:     Under general anesthesia, the left arm was prepped and draped in the usual fashion and an I&D was performed longitudinally over the dorsum of the left wrist.  Gross purulent material was obtained and cultures were taken for aerobes, anaerobes, fungus and acid-fast bacteria.  Blunt dissection was done along the extensor tendons and the pus seemed to track along the extensor compartment of her forearm.  A longitudinal incision was then made in the mid to proximal forearm dorsally.  Gross purulent material was obtained and by blunt dissection, the subcutaneous tissues were drained.  A longitudinal incision was then made over the anterior aspect of the biceps and the upper arm.  Purulent material shot out of the wound and there was a large amount of pus that was drained around the biceps. Blunt  dissection was carried up into the axilla with finger dissection bringing more purulent material down along the biceps.  A pulse lavage irrigator was used and the three wounds were irrigated out.  Rongeur was used to remove necrotic subcutaneous fat.  In the forearm wound, there was a necrotic extensor tendon that was debrided.  It appeared to be one of the digital extensors.  Antibiotic irrigation was used to irrigate out the wounds. They were packed with two inch iodoform packing and closed with two sutures in each wound to reapproximate the space.  Sterile dressings were applied. The patient was sent to the recovery room awake and stable in good condition. DD:  04/13/01 TD:  04/15/01 Job: [id number]

## 2011-04-14 NOTE — H&P (Signed)
Kimberly Chavez, Kimberly Chavez                      ACCOUNT NO.:  [id number]   MEDICAL RECORD NO.:  [id number]                   PATIENT TYPE:  INP   LOCATION:  0455                                 FACILITY:  [facility]   PHYSICIAN:  Clinton D. Maple Hudson, M.D.              DATE OF BIRTH:  [birth year]   DATE OF ADMISSION:  09/11/2003  DATE OF DISCHARGE:                                HISTORY & PHYSICAL   ADMITTING DIAGNOSES:  1. Empyema, Staphylococcus aureus, broadly sensitive.  2. Systemic lupus.  3. Debilitating chronic arthritis.   HISTORY OF PRESENT ILLNESS:  This is a 45 year old African-American woman  immunosuppressed by medications for lupus being admitted to manage an  asymptomatic methicillin-resistant Staphylococcus aureus empyema.  She had  been hospitalized in June 2002 at [facility] with multiple soft tissue  staphylococcal abscesses on her left arm and right leg, etiology unknown to  her.  She was referred to me with pleural effusion in the Spring of 2004.  This was tapped under ultrasound March 25, 2003 with lab reporting only a  few wbc's and a clotted specimen with total protein of 6.5, LDH of 1030  consistent with an exudate.  All cultures were negative including routine  AFB and fungal.  Cytology showed some atypical cells, can't exclude CA and  inflammation.  She was tapped again for reaccumulation on Apr 17, 2003 but I  think not cultured.  Cytology showed acute and chronic inflammation.  Both  of these specimens were from the left chest.  She was referred back to me in  early October at which time she weighed 129 pounds.  Chest x-ray showed a  substantial increase in right-sided pleural effusion, persistent smaller  left effusion, and she was breathless.  She was tapped again under  ultrasound on September 07, 2003 still leaving residual fluid on the right and  with CT at that time demonstrating effusions, a right lower lobe density  with rounded contour, atelectasis versus  pneumonia versus mass.  Laboratory  from the September 07, 2003 sample returned today with white blood count  reported 346,500 of which 64% were PMN's.  Culture grew abundant  Staphylococcus aureus resistant only to penicillin.  Cytology reported acute  inflammation.  Intracellular and/or extracellular organisms were present on  visual examination of the fluid.  Earlier this summer she had been seen by  Dr. Kellie Simmering for rheumatology.  There had been previous rheumatologic  examination with impression of possible rheumatoid arthritis 2 or 3 years  ago.  Dr. Kellie Simmering felt this was more consistent with lupus noting alopecia,  leukopenia in the range of 1500 wbc's, pleural effusions, and her arthritis.  ANA this summer was positive at 1:1280 with anti-DNA positive 1179,  sedimentation rate was 94 and rheumatoid factor was 64.  He started her on  prednisone 20 mg daily on May 18, 2003.  For a while this was dropped to 10  mg  daily but more recently is back up to 20 mg daily.  She was started on  Plaquenil May 18, 2003 ended on July 27, 2003.  She was started on Imuran  75 mg daily on July 27, 2003.  When I got her thoracentesis labs today, I  called her in for admission planning chest tube drainage and antibiotics.   PAST MEDICAL HISTORY:  Disabling severe polyarthritis apparently due to  lupus.  Staphylococcus soft tissue infections of arm and leg in 2002.  Bilateral pleural effusions 2004 as per HPI.  She has been pending right  total knee replacement by Dr. Lequita Halt, on hold because of pleural effusions.  She has had no other surgeries.  An echocardiogram on Apr 17, 2003 showed  normal left ventricle, normal right atrium and right ventricle with ejection  fraction 55-65%.  CT chest at the [facility] on February 27, 2003 was negative for PE  but showed bilateral effusions and atelectasis with progressive airspace  disease on the right, atelectasis versus pneumonia versus mass.  Thoracentesis x3 in 2004  as per HPI showing some atypia and acute and  chronic inflammation.  She denies any history of diabetes, HIV exposure, TB  exposure, DVT, or pulmonary embolism.   REVIEW OF SYSTEMS:  She know denies any pain including headache or joint  pain, any fever or chills, any bleeding or any adenopathy.  There is little  cough and scant white sputum; no palpitation, no problem with bowels or  bladder.   SOCIAL HISTORY:  Disabled by her arthritis; single; had been living with  brother; no children; never smoked; no alcohol; denies unprotected sex  (abstinent for 2 years), any transfusion, any HIV exposure, or any use of  street drugs.   FAMILY HISTORY:  Details not available - to be completed later.   EXAMINATION:  VITAL SIGNS:  Pulse regular about 100, respirations about 24  on room air.  GENERAL APPEARANCE:  Thin alert woman oriented with clear speech.  Significant contractures due to arthritis.  SKIN:  Depigmented scars left arm.  Mild alopecia.  No obvious acute rash.  ADENOPATHY:  None found.  HEENT:  Has own teeth.  Mucosa seems clear.  Speech and hearing seem normal.  There is no JVD or stridor.  CHEST:  Dull to percussion right base but otherwise clear.  HEART:  Regular rhythm.  No murmur, gallop, or rub is audible.  BREASTS:  No dominant mass.  ABDOMEN:  Soft without hepatosplenomegaly felt through clothing.  PELVIC AND RECTAL:  Not done.  EXTREMITIES:  Arthritic contractures.  Brace right knee.  No cords,  effusion, cyanosis, or clubbing.   IMPRESSION:  1. Right-side staphylococcal empyema culture positive for broadly sensitive     Staphylococcus aureus as of September 07, 2003 in an immunosuppressed     patient.  I am starting Ancef intravenously given her lack of acute     systemic complaints pending of infectious diseases and cardiovascular     thoracic surgery consults.  Chest x-ray tonight to confirm current status     of effusion, planning chest tube drainage. 2. Apparent  primary illness is systemic lupus with complications noted     above.  We will continue Imuran and prednisone as per outpatient     management for now.  3. Debilitating chronic arthritis with total knee replacement on hold.  Note     that she consents to HIV assay after informed discussion.  Clinton D. Maple Hudson, M.D.    CDY/MEDQ  D:  09/11/2003  T:  09/11/2003  Job:  [id number]   cc:   Ralene Ok, M.D.  [address]  [place]  [place] [address]  Fax: [phone]   Ollen Gross, M.D.  [address]  [place]  [place] [address]  Fax: [phone]   Aundra Dubin, M.D.   Lacretia Leigh. Ninetta Lights, M.D.  [address]  [place]  [place] [address]  Fax: [phone]

## 2011-04-14 NOTE — Op Note (Signed)
NAMEAJAH, VANHOOSE            ACCOUNT NO.:  [id number]   MEDICAL RECORD NO.:  [id number]          PATIENT TYPE:  AMB   LOCATION:  SDC                           FACILITY:  [facility]   PHYSICIAN:  Roseanna Rainbow, M.D.DATE OF BIRTH:  [birth year]   DATE OF PROCEDURE:  05/24/2006  DATE OF DISCHARGE:  05/24/2006                                 OPERATIVE REPORT   PREOPERATIVE DIAGNOSIS:  Recurrent left Bartholin's cyst.   POSTOPERATIVE DIAGNOSIS:  Left Bartholin's abscess.   PROCEDURE:  Extirpation of left Bartholin's gland.   SURGEONS:  1.  Roseanna Rainbow, M.D.  2.  Charles A. Clearance Coots, M.D.   ANESTHESIA:  General anesthesia with local anesthesia.   ESTIMATED BLOOD LOSS:  Approximately 350 mL.   FINDINGS:  The left Bartholin's gland was filled with purulent material.  It  measured several centimeters in diameter.   PROCEDURES:  The patient was taken to the operating room.  General  anesthesia was administered without difficulty.  She was placed in the  dorsal lithotomy position and prepped and draped in the usual sterile  fashion.  The mucocutaneous junction overlying the Bartholin's cyst was then  infiltrated with 1% lidocaine.  The skin was then incised down to the cyst  wall.  The cyst was then enucleated.  This cyst was inadvertently ruptured  during this process and purulent material was noted; cultures were sent.  The base of the gland was serially clamped with mosquito clamps, transected  and suture ligatures were used to secure the blood supply to the gland.  Again, the gland was completely excised.  The defect in the labia majus was  then reapproximated in layers using a running suture of 2-0 Vicryl.  The  vaginal mucosa was then reapproximated in a running fashion with 3-0 Vicryl.  At the close of the procedure, the instrument and pack counts were said to  be correct x2.  The patient was taken to the PACU, awake and in stable  condition.   PATHOLOGY:  Left  Bartholin's gland.      Roseanna Rainbow, M.D.  Electronically Signed     LAJ/MEDQ  D:  05/25/2006  T:  05/25/2006  Job:  [id number]

## 2011-04-14 NOTE — Discharge Summary (Signed)
[facility]  Patient:    Kimberly Chavez, Kimberly Chavez                   MRN: [id number] Adm. Date:  [id number] Disc. Date: 06/26/01 Attending:  Faith Rogue T Dictator:   Mcarthur Rossetti. Angiulli, P.A. CC:         Dr. Georgena Spurling  Dr. Aundra Dubin  Dr. Burnice Logan  Dr. Heather Roberts   Discharge Summary  DISCHARGE DIAGNOSES: 1. 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 2. Status post irrigation and debridement of left wrist, forearm, left upper    extremity, right lower extremity; pain management.  HISTORY OF PRESENT ILLNESS:  This is a 45 year old black female admitted to [facility] Apr 12, 2001 with recent diagnosis of rheumatoid arthritis diagnosed in July 2001 and recently placed on prednisone and Plaquenil, who now presented with increased joint swelling and arthralgias. Upon evaluation, white blood cell count was 17,000; sedimentation rate 48.  CT scan of the right upper extremity showed tissue swelling of the right biceps region.  CT of the pelvis showed extensive left gluteal and posterior thigh abscesses measuring 4.5 cm.  Also noted with induration of the right calf. Blood cultures x 4 were negative.  Aspiration of the right wrist showed moderated staphylococcus.  Patient found to have numerous Staph abscesses. Underwent irrigation and debridement of the left wrist, forearm, left upper extremity on May 18, and right leg irrigation and debridement on May 18 per Dr. Sherlean Foot.  Her prednisone was decreased and monitored.  Follow-up per rheumatology services.  She was placed on intravenous Ancef and Rifampin for antibiotic coverage.  Maintained on subcutaneous heparin for deep vein thrombosis prophylaxis.  Echocardiogram completed showed increased right ventricular size, worrisome for pulmonary hypertension.  A TEE follow-up showed normal left ventricular function and ejection  fraction 65%.  No vegetation of the valve.  Slow overall progress.  Maximum assistance for mobility.  She had also received a spiral CT of the chest after echocardiogram showing no signs of pulmonary edema.  Latest chemistries unremarkable.  She was admitted for comprehensive rehab program.  PAST MEDICAL HISTORY:  See HPI.  HABITS:  No alcohol or tobacco.  ALLERGIES:  None.  PRIMARY PHYSICIAN:  Dr. Heather Roberts.  MEDICATIONS PRIOR TO ADMISSION: 1. Prednisone 5 mg twice daily. 2. Aleve.  SOCIAL HISTORY:  Unemployed, lives with sister.  One-level home, four steps to entry.  Independent prior to admission.  Sister works shift work.  HOSPITAL COURSE:  Patient with progressive gains while on rehabilitation services with therapies initiated on a b.i.d. basis.  The following issues are followed during patients rehab course.  Pertaining to Ms. Chappells multiple Staphylococcus abscesses with polyarthritis - remained stable.  Multiple irrigation and debridements of the left wrist, forearm, left upper extremity, and right lower extremity with ongoing healing and skin care as advised.  She continued on her prednisone 5 mg twice daily as well as intravenous Ancef and Rifampin.  Her prednisone was increased to 10 mg twice daily on May 03, 2001 and monitored.  Her final discharge dose would still be 5 mg daily.  Pain management was ongoing.  Adjustments were made as needed.  She had increased tone and spasm.  She responded well to Baclofen.  Her OxyContin continued to be increased to 30 mg q.12h. on July 7, 40 mg q.12h. on July 10, 60 mg on July 13 twice daily.  She  responded well.  At time of discharge she was on 60 mg q.12h.  This would have to be tapered with time.  She would follow up with Dr. Faith Rogue.  She had completed her course of antibiotics on July 29.  Her subcutaneous heparin was later discontinued as her mobility had improved. Ongoing care was provided follow-up of  infectious disease for multiple staph abscesses.  Her mobility improved as her pain management was brought under better control.  She was now ambulating minimal assistance of 50 feet x 2 with bilateral platform walker; minimal assistance for activities of daily living of dressing, grooming, and homemaking.   Overall, her strength and endurance continued to improve.  Initial plan was for nursing home but now the family can provide the necessary care at home.  She would be discharged on July 31. Long hospital stay precipitated some depression.  She received follow-up and emotional support through Dr. Eula Flax.  She was more encouraged as her mobility improved.  Her blood pressures remained controlled.  She was placed on Tenormin for some tachycardia which was asymptomatic, felt to be overall due to her deconditioning.  She was voiding without difficulty.  Her bowel program had been regulated.  LABORATORY DATA:  Latest labs showed sodium 135, potassium 3.8, BUN 5, creatinine 0.5.  Liver function studies within normal limits.  Hemoglobin 10.7; hematocrit 31.9; platelet count 364,000.  She would be discharged to home on July 31 with home health physical and occupational therapy.  DISCHARGE MEDICATIONS: 1. Calcitonin nasal spray one puff every other day alternating nostrils. 2. Multivitamin daily. 3. Prednisone 5 mg daily. 4. Tenormin 25 mg twice daily. 5. OxyContin CR 60 mg q.12h. 6. Tylenol as needed. 7. Skelaxin 400 mg q.8h. as needed spasms.  ACTIVITY:  As tolerated.  DIET:  Regular.  WOUND CARE:  Ongoing.  Cleanse incision daily with warm soap and water.  FOLLOW-UP:  Home health physical and occupational therapy.  She would follow up with Dr. Faith Rogue of rehab services and Dr. Heather Roberts of medical management. DD:  06/25/01  TD:  06/25/01 Job: [id number]

## 2011-04-14 NOTE — Op Note (Signed)
[facility]  Patient:    Kimberly Chavez, Kimberly Chavez                   MRN: [id number] Proc. Date: 04/13/01 Adm. Date:  [id number] Attending:  Heather Roberts                           Operative Report  PREOPERATIVE DIAGNOSIS:  Right leg abscess.  POSTOPERATIVE DIAGNOSIS:  Right leg abscess.  PROCEDURE:  Right leg I&D.  SURGEON:  Georgena Spurling, M.D.  ASSISTANT:  None.  ANESTHESIA:  General.  INDICATION FOR PROCEDURE:  The patient is a 45 year old black female, who was admitted yesterday evening, and I was consulted for swelling and infection in the right upper extremity.  I aspirated the wrist and sent it for cultures and got a contrast CT study to identify the abscess up the arm and consultation with a hand surgeon, Dr. Lanora Manis Meyerdierks.  She was then scheduled for surgery this morning, and Dr. Metro Kung noticed induration in the calf, as the patient was on the operating room table.  I was called to the operating room, examined the calf.  It was indeed indurated and felt fluctuant.  I then sterilely prepped the calf, aspirated purulence out of it, and performed the irrigation and debridement under emergency conditions since the patient was already under anesthesia.  DESCRIPTION OF PROCEDURE:  The patient was supine.  We prepped and draped the lower extremity in a standard fashion.  We used a #10 blade to make a 5-6 cm incision on the medial aspect of the calf to gain access to the deep and superficial posterior compartments of the leg.  Once we were through the skin and subcutaneous tissue, the was approximately 150 cc of purulence evacuated. It had eroded the fascia overlying the deep posterior compartment and superficial posterior compartments.  I debrided all of the infected fascia down to healthy muscle belly.  I then palpated proximally and distally, and there were no further pockets of pus that I could appreciate.  We then used 3 liters of  normal saline through the pulse lavage system, irrigating the wound. I then packed the wound with moistened Kerlix dressings and dressed with two ABDs, Kerlix, and a ______ wrap.  Tourniquet time none.  Complications none. EBL minimal.  The patient will need to return to the operating room for a second dressing change, I&D, and potentially more after that. DD:  04/13/01 TD:  04/14/01 Job: [id number]

## 2011-04-14 NOTE — Discharge Summary (Signed)
NAMEMARTY, SADLOWSKI                      ACCOUNT NO.:  [id number]   MEDICAL RECORD NO.:  [id number]                   PATIENT TYPE:  INP   LOCATION:  2008                                 FACILITY:  [facility]   PHYSICIAN:  Eber Jones A. Thelma Barge, P.A.            DATE OF BIRTH:  06-05-1966   DATE OF ADMISSION:  DATE OF DISCHARGE:  09/20/2003                                 DISCHARGE SUMMARY   ADMISSION DIAGNOSIS:  Chronic empyema of the right chest.   SECONDARY DIAGNOSES:  1. Severe, debilitating polyarthritis.  2. Systemic lupus erythematosus with immunosuppression.  3. Anemia.  4. History of thyroid nodule on chest CT.  5. Methicillin-sensitive Staphylococcus aureus empyema of right chest.   HOSPITAL PROCEDURES:  1. Attempted right video assisted thorascopic surgery with right     thoracotomy, drainage of empyema with decortication, all completed on     September 14, 2003.  2. PICC line placement.   CONSULTATIONS:  1. Infectious disease.  2. Care management for Home Health care issues.  3. Rheumatology.   HISTORY OF PRESENT ILLNESS:  The patient is a 45 year old African American  woman who has had significant history of right and left-sided pleural  effusions since the Spring of 2004.  She was initially referred to Dr. Maple Hudson  in April 2004 for a left-sided pleural effusion.  This was ultimately taped  under ultrasound on two different occasions.  The cultures were all negative  including routine AFB and fungal.  She was referred back to Dr. Maple Hudson in  early October, at which time a chest x-ray showed a substantial increase in  the right-sided pleural effusion and persistent smaller left effusion.  The  patient was significantly short of breath at that time and had lost quite a  bit of weight.  She was taped again under ultrasound on September 07, 2003.  Post thoracentesis CT scan demonstrated a residual fluid on the right.  The  CT scan also revealed a right lower-lobe density  that was noted as  atelectasis versus pneumonia versus mass.  Laboratory values from the  September 07, 2003, sample returned with a white blood count of 346,000 and  64% PMN.  The culture grew abundant Staphylococcus aureus resistant only to  penicillin.  Cytology from that sample reported acute inflammation.  The  patient was then called in for admission, planning chest tube drainage and  antibiotics for this methicillin sensitive Staphylococcus aureus right chest  empyema.   HOSPITAL COURSE:  The patient was subsequently admitted on October 15 of  2004.  At the time of admission, the pulmonary and rheumatology services  were notified, and infectious disease consultation was also initiated for  the treatment of this somewhat complicated infection.  Dr. Edwyna Shell of  thoracic surgery also saw the patient for evaluation and possible surgical  intervention.  The plan at that time was to proceed with a right video  thorascopic surgical procedure through which  the empyema would be drained,  and the patient would undergo decortication and chest-tube placement.  The  risks, benefits, complications, and alternatives to the procedure were  discussed with the patient.  She agreed to proceed with surgery at that  time.   While awaiting surgery, the patient was treated with IV antibiotics.  She  was followed closely by the rheumatology and pulmonary services in addition.  She remained stable and comfortable, and surgery was planned for October 18.   On October 18, the patient was then taken to the operating room at which  time she underwent a right VATS, right mini-thoracotomy, right upper lobe/  right lower-lobe biopsy, pleurectomy and decortication.  The patient  tolerated the procedure well, and was transferred to the post anesthesia  care unit in stable condition.  The cultures taken in the operating room  revealed Staphylococcus aureus resistant only to penicillin.  The patient  was subsequently  treated with IV Ancef.   Over the next several postoperative days, the patient's chest tube showed an  air-leak.  The chest tube was maintained secondary to the air-leak.  The  output was minimal, and the patient was not having any breathing difficulty.  She remained afebrile, and hemodynamically stable.  All of her preoperative  medications were initiated, and she continued to be followed by  rheumatology, infectious disease and pulmonary in conjunction with the  thoracic surgery team.   On postoperative day number 3, a PICC line was placed with anticipation of  long-term IV antibiotics.  The patient tolerated this procedure well without  any complications.   Ultimately on postoperative day number 4, the air-leak in the chest tube was  no longer apparent.  It was deemed safe for the chest tube to be moved.  After chest tube removal, there was a stable 5% right pneumothorax on chest  x-ray.   Over the next several days, the patient remained hemodynamically stable,  oxygenating well, ambulating in the hallways without difficulty.  She  continued with her IV antibiotics therapy, and was deemed in good condition  for discharge on September 20, 2003.   ALLERGIES:  No known drug allergies.   DISCHARGE MEDICATIONS:  1. Prednisone 10 mg p.o. b.i.d.  2. Calcium D, 600 mg p.o. daily.  3. Ancef 2 grams IV q.8h with a stop date of October 04, 2003, for a total     of 21 days.  4. Tylox one to two tablets p.o. q.4-6h. p.r.n. for pain.   DISCHARGE INSTRUCTIONS:  1. Activity:  The patient may shower, no heavy lifting, no driving.  2. Diet:  The patient is to follow a regular diet with no restrictions.  3. Wound care:  The patient is to wash incisions daily with soap and water.     Do not submerge in water until all incisions are completely healed.  4. Special instructions:  The patient is to be seen by [facility] who will     administer her antibiotics for the duration of her  therapy.  FOLLOWUP APPOINTMENT:  1. The patient is to see Dr. Edwyna Shell on November 9, at 3 p.m. with a chest x-     ray at the [facility] at 2 p.m.  2. The patient is to see her local physician within one to two weeks of     discharge.  Carolyn A. Eustaquio Boyden.    CAF/MEDQ  D:  09/18/2003  T:  09/19/2003  Job:  [id number]   cc:   Young, Dr.   Ollen Gross, M.D.  [address]  [place]  [place] [address]  Fax: [phone]   Ralene Ok, M.D.  [address]  [place]  [place] [address]  Fax: [phone]   Aundra Dubin, M.D.   Lacretia Leigh. Ninetta Lights, M.D.  [address]  [place]  [place] [address]  Fax: [phone]

## 2011-04-14 NOTE — Op Note (Signed)
[facility]  Patient:    Kimberly Chavez, Kimberly Chavez                   MRN: [id number] Proc. Date: 04/15/01 Adm. Date:  [id number] Attending:  Heather Roberts                           Operative Report  PREOPERATIVE DIAGNOSIS:  Right leg infection and irrigation and debridement.  POSTOPERATIVE DIAGNOSIS:  Right leg infection and irrigation and debridement.  PROCEDURE:  Right leg incision and drainage and closure.  SURGEON:  Georgena Spurling, M.D.  ASSISTANT:  None.  ANESTHESIA:  General endotracheal.  INDICATION FOR PROCEDURE:  The patient is a 45 year old white female, status post irrigation and debridement of her right calf and left upper extremity by myself and Dr. Tennis Must. Meyerdierks two days ago.  She needed to return for irrigation and debridement and possible closure.  Informed consent was obtained.  DESCRIPTION OF PROCEDURE:  She was laid supine and administered general endotracheal anesthesia.  The left upper extremity and the right lower extremity were prepped and draped in the usual sterile fashion.  Dr. Metro Kung performed the left upper extremity and will dictate that separately.  The right lower extremity, after being sterilely prepped and draped, I used 3 liters of normal saline via through the pump lavage system to irrigate and debride the wound.  The wound was free of purulence and had a good granulating bed over the medial head of the gastrocnemius.  After a third debridement and irrigation, I closed the wound with interrupted horizontal 2-0 nylon sutures.  I dressed the wound with Adaptic and 4 x 4s, ABD, sterile Webril, and I did leave a quarter-inch Penrose drain deep to the closure.  The patient tolerated the procedure well.  Tourniquet was none.  Complications were none.  Drain was one Penrose. DD:  04/15/01 TD:  04/15/01 Job: [id number]

## 2011-04-14 NOTE — H&P (Signed)
[facility]  Patient:    Kimberly Chavez, Kimberly Chavez                   MRN: [id number] Adm. Date:  [id number] Attending:  Heather Roberts                         History and Physical  Patient with flare of rheumatoid arthritis and possible septic left wrist. Consultation with orthopedics and possible IV antibiotics in addition to IV steroids. DD:  04/12/01 TD:  04/14/01 Job: [id number]

## 2011-04-14 NOTE — Op Note (Signed)
NAMECHARMANE, PROTZMAN NO.:  [id number]   MEDICAL RECORD NO.:  [id number]          PATIENT TYPE:  INP   LOCATION:  0008                         FACILITY:  [facility]   PHYSICIAN:  Ollen Gross, M.D.    DATE OF BIRTH:  03-Mar-1966   DATE OF PROCEDURE:  10/03/2004  DATE OF DISCHARGE:                                 OPERATIVE REPORT   PREOPERATIVE DIAGNOSIS:  Rheumatoid arthritis, right knee.   POSTOPERATIVE DIAGNOSIS:  Rheumatoid arthritis, right knee.   PROCEDURE:  Right total knee arthroplasty.   SURGEON:  Gus Rankin. Aluisio, M.D.   ASSISTANT:  Avel Peace, PA-C   ANESTHESIA:  General with postop Marcaine pain pump.   ESTIMATED BLOOD LOSS:  Minimal.   DRAIN:  Hemovac x 1.   COMPLICATIONS:  None.   TOURNIQUET TIME:  65 minutes at 300 mmHg.   CONDITION:  Stable to recovery.   BRIEF CLINICAL NOTE:  Kimberly Chavez is a 45 year old female with severe rheumatoid  arthritis, who has gone to develop end-stage arthritic changes of the right  knee with essential clinical fusion of the right knee.  Radiographically,  she is not fused, but clinically she is fused at about 10 degrees and cannot  move the knee.  She has intractable pain and presents now for total knee  arthroplasty.   PROCEDURE IN DETAIL:  After the successful administration of general  anesthetic, a tourniquet is placed high on the right thigh and right lower  extremity prepped and draped in the usual sterile fashion.  Standard midline  incision is made with a 10 blade through the subcutaneous tissue to the  level of the extensor mechanism.  A fresh blade is used to make a medial  parapatellar arthrotomy.  The patella is fused to the femur, and I took an  osteotome and pried the patella off the femur.  It was mainly a fibrous  fusion and not a bony fusion.  I subluxed the patella laterally and, again,  there was a fibrous fusion between the femur and tibia, and I separated the  two out and was able to flex  the knee 90 degrees.  We then removed the PCL.  The ACL was completely gone.  We used a drill to create a starting hole in  the distal femur and proximal tibia to allow for intramedullary reaming.  I  want to put stems in both the tibial and femoral canal given her weak bone  stock to prevent against fracture.  We reamed the femoral side up to 18 mm,  tibial side up to 15 mm.  The intramedullary cutting guide is placed on the  tibial side, and we removed about 10 mm from the joint surface.  Size 2 is  the most appropriate tibial component, and we prepared the proximal tibia  for the NBT revision tray with the conical reamer for a size 2.   I flexed the knee 90 degrees, placed an 18 mm sleeve into the femur and  placed the 5-degree right valgus alignment guide on the distal femur.  Removed 10 mm off the distal femur.  The size  is a 2.5.  I placed the spacer  block to 10 mm on the tibial surface and put the 18 cutting block onto the  femoral surface to match her rotation.  We had to place this in the plus 2  position to effectively lower the femoral component down to the anterior  cortex of the femur.  The anterior and posterior cuts are then made.  A  revision block is used to place the chamfer cuts, and we went up to a TC3  for the intercondylar cut in order to gain better stability since her  ligaments were questionable.  We placed the trial size 2 NBT revision tray  with a 30 mm stem extension, then a trial 2.5 TC3 femur with the stem in a  plus 2 position and the stem being 18 x 125.  Placed a 10 mm posterior  stabilized rotating platform insert TC3 post.  She achieved full extension  with excellent varus and valgus balance.  I was able to get her flexed to  about 100 degrees which I felt was excellent, considering her preoperative  status.  We everted the patella.  Thickness was about 22 mm composite.  We  cut the bone surface down to about 12 mm and placed a 32 trial patella after   drilling the lug holes.  We had to do a lateral release to get her to track  normally.  I was able to get her flexed to about 110 degrees.  The patella  remained tracking centrally in the trochlea.  We then removed all the trial  components and put the size 5 cement restrictor at the appropriate depth in  the tibial canal.  The cut bone surfaces are then thoroughly prepared with  pulsatile lavage.  The components are placed together on the back table with  the sizes as stated above.  Two batches of cement are then mixed with 2 g of  vancomycin.  We injected the cement into the tibial canal and placed the MBT  revision tray, size 2, with a 30 mm stem extension.  The femoral component  is placed press-fit on the stem with cement on the actual femoral component.  This is impacted, and we placed a 10 mm trial insert, held the knee in full  extension, and removed all the extruded cement.  The 32 patella is placed  and cemented into place.  Once the cement is fully hardened, then the  permanent 10 mm PC3 rotating platform prosthesis is placed.  The wound is  then copiously irrigated with saline solution and the extensor mechanism  closed over a Hemovac drain with interrupted #1 PDS.  We left a lateral  release open.  Tourniquet is released for a total time of 65 minutes.  Flexion against gravity is about 100-110 degrees.  Subcu is closed with  interrupted 2-0 Vicryl, subcuticular with running 4-0 Monocryl.  The  catheter for the Marcaine pain pump is placed and the pump initiated.  Steri-  Strips and a bulky sterile dressing are applied.  Drain is hooked to  suction, and she is placed into a knee immobilizer, awakened, and  transported to recovery in stable condition.     Kimberly Chavez   FA/MEDQ  D:  10/03/2004  T:  10/03/2004  Job:  [id number]

## 2011-04-14 NOTE — Consult Note (Signed)
[facility]  Patient:    Kimberly Chavez, Kimberly Chavez                   MRN: [id number] Proc. Date: 04/17/01 Adm. Date:  [id number] Attending:  Heather Roberts CC:         Heather Roberts, M.D.   Consultation Report  HISTORY OF PRESENT ILLNESS:  The patient is a very unfortunate 45 year old black female that I have been asked to see for abnormal echo and questionable developing SIRS/sepsis.  Patient has a history of questionable RA and presented with subcutaneous abscesses of her left upper extremity and calf. She has had I&Ds of these and is growing methacillin-sensitive Staph aureus. She is on appropriate antibiotics and followed by infectious disease.  She has had an echo to rule out vegetations, which saw an increased RV size that was worrisome for pulmonary hypertension.  She underwent spiral CT today.  She had no PE noted and minimal patchy density at the right lower lobe that I suspect is primarily atelectasis.  Patient currently is ill but hemodynamically stable with no significant hypoxemia.  PAST MEDICAL HISTORY:  Significant for her arthritis, which has been followed in the past by rheumatology; otherwise, it is unremarkable.  SOCIAL HISTORY:  She is unemployed.  She lives with her sister.  FAMILY HISTORY:  Remarkable for her mother dying of a sarcoma of some type. She has two sisters alive and well.  REVIEW OF SYSTEMS:  As per history of present illness.  PHYSICAL EXAMINATION:  GENERAL:  She is an ill-appearing black female in no acute distress.  VITAL SIGNS:  Blood pressure is 148/90, pulse is 130, temperature is 98.9 and her respiratory rate is approximately 20.  HEENT:  Pupils equal, round and reactive to light and accommodation. Extraocular movements are intact.  Nares patent without discharge.  Oropharynx does show very prominent thrush.  NECK:  Supple without JVD or lymphadenopathy.  There is no palpable thyromegaly.  CHEST:   Minimal basilar crackles primarily due to poor inspiratory effort. There is no wheezing or rhonchi.  CARDIAC:  Tachycardic but regular rhythm with no significant murmur.  There is no prominent P2.  ABDOMEN:  Soft, nontender, with good bowel sounds.  GENITAL:  Not done, not indicated.  RECTAL:  Not done, not indicated.  BREASTS:  Not done, not indicated.  EXTREMITIES:  Her left arm has been wrapped in Ace bandages after the I&D. Her left lower extremity is normal.  Her right lower extremity shows mild edema and also has a bandage from recent surgery.  Pulses were intact distally.  NEUROLOGIC:  She is alert and oriented x 3 and can move all four extremities.  LABORATORY DATA:  Chest x-ray was reviewed and shows a minimal right basilar density.  Spiral CT shows no pulmonary emboli or significant lymphadenopathy.  There is a very patchy density, it is nonspecific and most likely atelectasis in the right lower lobe; otherwise, it is totally clear.  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  I think it is important to know whether she has endocarditis or not.  I think she would benefit from a transesophageal echocardiography to evaluate her valves and also to get a better look at her right ventricle. There is no pulmonary embolus by CT scan.  If she does indeed have pulmonary hypertension, I suspect it is due to some type of connective tissue overlap syndrome.  Currently, she is not hemodynamically unstable and is oxygenating  well.  SUGGESTION: 1. No indication for ICU transfer at this time.  She certainly needs to be    monitored closely. 2. Would get transesophageal echo. 3. Treatment of prominent oral thrush. DD:  04/17/01 TD:  04/18/01 Job: [id number]

## 2011-04-14 NOTE — Consult Note (Signed)
[facility]  Patient:    Kimberly Chavez, Kimberly Chavez                   MRN: [id number] Adm. Date:  [id number] Attending:  Heather Chavez CC:         Kimberly Chavez, M.D.  Lowell Bouton, M.D.  Lacretia Leigh. Ninetta Lights, M.D.   Consultation Report  RHEUMATOLOGY CONSULTATION  CHIEF COMPLAINT:              Joint pain and nodules.  I spoke with Dr. Orson Aloe on Apr 13, 2001 about Kimberly Chavez. At that time, she was going to surgery for wrist abscess. Her history is that over the last three to four days, she began having pus and nodules to form all through the left arm and the right calf. These have been aspirated and Gram positive cocci have been identified. She is on antibiotics. Dr. Orson Aloe talked with Dr. Jimmy Footman with whom Kimberly Chavez had been working with over some time. She is presently is no longer working with Dr. Jimmy Footman. She has a diagnosis of polyarthritis and sclerodactyly. She has a positive rheumatoid factor and recently has an ESR of 48. She has been treated with prednisone 5 mg b.i.d. and has been off of Plaquenil recently. She tells me that her arthritis has been doing well over the last several months except for swelling to the right knee. She generally has been taking 5 mg of prednisone and Celebrex each day. She has had no fever concerning the nodules to her knowledge. She has had some blood from her stool. She is now requiring two units of packed red blood cells.  She was taken to the operating room and the right wrist and right calf were debrided.  LABORATORIES:                 From this hospitalization, show a WBC of 7.5, improving to 13.4. Her hemoglobin has been 9.1 and 8.8. MCV was 79. Her creatinine was 0.7, total bilirubin 10.2, albumin 2.3, ALT 44, alkaline phosphatase 208 and 184.  REVIEW OF SYSTEMS:            She tells me that her hands have been swollen and very cold throughout the last two years. She does not  recognize the term Raynauds. She says Dr. Jimmy Footman had placed her on a "blood thinner." She has a rash to the right third dorsal MCP. She has some headaches. Her energy level is fair.  PAST MEDICAL/SURGICAL HISTORY:                      Diagnosis of rheumatoid arthritis versus polyarthritis. She denies problems with diabetes, hypertension, or heart disease.  CURRENT MEDICATIONS:          Naproxen 500 mg b.i.d., ______ 20 mg b.i.d., Solu-Medrol 30 mg b.i.d., Plaquenil 200 mg b.i.d., Ancef q.8h., Restoril p.r.n., Darvocet p.r.n., Compazine p.r.n., Benadryl p.r.n., naloxone p.r.n., OxyContin p.r.n.  FAMILY HISTORY:               Her mother has died with some type of lung problem. She has three brothers and two sisters that are alive and well. Her father is in fair health.  SOCIAL HISTORY:               She is accompanied by a sister during the history and physical. She lives with another sister. She is a native of [place], [place]. She complete high school. 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 She played sports through high school. She does not smoke and denies using alcohol or drugs.  PHYSICAL EXAMINATION:  VITAL SIGNS:                  Blood pressure 151/88, temperature 96.8, respirations 16, pulse 78.  GENERAL:                      She is a healthy appearing woman.  SKIN:                         She has an open area to the right third knuckle but this is not weeping. She has red mottling with abrasions and scabs to the tips of her fingers, no nail fold dilatation. Left hand was difficult to examine with it being bandaged as was the right calf. The thighs were unremarkable.  HEENT:                        PERRL/EOMI. Mouth clear.  NECK:                         Negative JVD.  LUNGS:                        Clear.  HEART:                        Regular, no murmur.  ABDOMEN:                      Negative HSM, nontender.  MUSCULOSKELETAL:              Again,  this is very difficult with the bandaging. There is no tenderness to the right MCPs; they look to be slightly swollen. The wrist has an IV in and I did not move it very much. She has a 20 degree flexion contracture of the right elbow, and I was basically unable to examine the left arm and shoulder. The left knee was cool and moved without tenderness. The right knee was hard to examine because she was tender with the bandage to the calf. The ankles and feet were nonswollen and nontender.  ASSESSMENT AND PLAN: #1 - HISTORY OF POLYARTHRITIS: She has been on prednisone for a number of months, if not a year. I am not seeing significantly swollen joints but will admit the exam in inadequate with her bandaged hands and legs. She evidently is infected from these pustular nodules that have been found. I believe it wise to decrease the prednisone. She has been having some possible bleeding per rectum and I will stop the naproxen at this time also.  #2 - PUSTULAR NODULES:  I have not observed these but other physicians have. She is on antibiotics and she has undergone surgery for two areas.  #3 - ANEMIA:  This is slightly microcytic. She is receiving blood during the exam at this time.  I will check back with her in a couple of days. She will be following up with me as an outpatient.DD:  04/14/01 TD:  04/14/01 Job: [id number]

## 2011-04-14 NOTE — Op Note (Signed)
[facility]  Patient:    Kimberly Chavez, Kimberly Chavez                   MRN: [id number] Proc. Date: 04/24/01 Adm. Date:  [id number] Attending:  Heather Roberts                           Operative Report  PREOPERATIVE DIAGNOSIS:  Left hip infection.  POSTOPERATIVE DIAGNOSIS:  Left hip infection.  PROCEDURE:  Left hip incision and drainage (deep).  SURGEON:  Georgena Spurling, M.D.  ASSISTANT:  Jamelle Rushing, P.A.-C.  ANESTHESIA:  General.  INDICATION FOR PROCEDURE:  The patient is a 45 year old with connective tissue disorder, multiple abscesses.  She is over a week out status post I&D of abscesses of the left upper extremity and right lower extremity.  She continued to spike fevers, and a total body CT scan revealed an abscess deep to the tensor fascia lata in the left hip, tracking deep to the gluteus medius down into the posterior thigh.  Informed consent was obtained.  DESCRIPTION OF PROCEDURE:  The patient was placed supine and administered general endotracheal anesthesia, laid in the right-down, left-up lateral decubitus position.  The hip was prepped and draped in the usual sterile fashion.  A small southern-type incision was made approximately 6 inches in length in a curvilinear fashion.  Cautery dissection was performed down to the level of the fascia lata.  Once we pierced the fascia lata, we had purulence which we evacuated, approximately 200 cc of pure pus, which was quite thick. There was totally necrotic bursa in this area which had been eaten by the infection.  There was also a tough sheath of tissue around the sciatic nerve. We debrided the entire bursa.  We tried to gently peel off some of the debris off of the sciatic nerve, taking care not to injure it.  We then irrigated wit the pulse lavage system over 3000 cc.  At this point, we continued the debridement back to stable bleeding healthy bed of tissue.  We then irrigated once again and then  left a large Hemovac deep to the fascia lata, closed the fascia lata with interrupted #1 PDS sutures.  We closed the deep soft tissue with interrupted 0 sutures, subcuticular with 2-0 PDS suture and skin staples. We then dressed with Adaptic 4 x 4s, ABD, and tape.  The patient tolerated the procedure well.  Estimated blood loss 100 cc.  Complications none.  Drain was one large Hemovac. DD:  04/24/01 TD:  04/24/01 Job: [id number]

## 2011-04-14 NOTE — Discharge Summary (Signed)
[facility]  Patient:    Kimberly Chavez, Kimberly Chavez                   MRN: [id number] Adm. Date:  [id number] Disc. Date: 05/03/01 Attending:  Heather Roberts CC:         Georgena Spurling, M.D.  Barbaraann Share, M.D. [facility]  Lowell Bouton, M.D.  Aundra Dubin, M.D.  Rockey Situ. Flavia Shipper., M.D.   Discharge Summary  DISCHARGE DIAGNOSES: 1. Multiple Staphylococcus abscesses. 2. Polyarthritis with mixed connective tissue disorder. 3. Chronic prednisone therapy.  CONSULTS:  Dr. Sherlean Foot, Dr. Shelle Iron, Dr. Metro Kung, Dr. Kellie Simmering, Dr. Burnice Logan, Dr. Orson Slick.  PROCEDURES: 1. On Apr 13, 2001, incision and drainage of left wrist, left forearm, and    left upper arm, Dr. Metro Kung, [facility] 2. On Apr 14, 2001, incision and drainage of left knee abscess. 3. On Apr 15, 2001, removal of packing with irrigation of wound and a partial    closure of left upper extremity, s/p incision and drainage of multiple    abscesses, left upper extremity. 4. Clarification on Apr 13, 2001, Dr. Sherlean Foot with I&D of right leg abscess. 5. On Apr 24, 2001, Dr. Sherlean Foot, left hip incision and drainge, deep.  HISTORY OF PRESENT ILLNESS:  The patient is a 45 year old black single female who is admitted through [facility] (service admission) for arthralgias and swelling.  At the time of admission, the patient stated she was seeking disability for "severe rheumatoid arthritis" and had been discharged by her previous primary care physician for noncompliance.  She had been taking prednisone 5 mg twice a day.  She had an eight month history of arthralgias, January 04, 2000, at [facility] with one week prior to admission right knee pain, swelling, arthralgias, stiffness of her right shoulder, swelling and pain of her forearm and wrist.  PAST MEDICAL HISTORY:  On July 2001, diagnosis of arthralgias and polyarthritis.  Followed previously by a  rheumatologist.  Had been on prednisone and Plaquenil, but the patient is not taking Plaquenil.  SOCIAL HISTORY:  She lives with her sister, was unemployed.  MEDICATIONS:  Prednisone 5 b.i.d.  FAMILY HISTORY:  Mother died of a lung sarcoma; three brothers and two sisters alive and well.  Denied family history of cardiac, diabetes, or thyroid.  REVIEW OF SYSTEMS:  HEENT - denied allergies.  CARDIAC:  Denied chest pain, shortness of breath.  PULMONARY:  Denied history of asthma or shortness of breath.  GI:  Denies history of PUD.  She did have a history of rectal bleeding and had been scheduled to see Dr. Virginia Rochester, but had not kept that appointment.  PHYSICAL EXAMINATION:  On admission revealed a very uncomfortable slender black female lying on the hospital gurney.  Temperature 97, respiratory rate 24, pulse 120 sinus, and blood pressure 105/82.  HEENT - fundi is grossly normal.  Pharynx not injected.  Carotids 2+, no thyromegaly.  Cardiac - she had a soft 1/6 SEM with tachycardia.  Lungs are clear to A&P.  Abdomen soft with no HSM.  Extremities exam was significant for swelling below her right knee, pain of both of her wrists and forearm.  LABORATORY DATA:  On admission showed white count of 17, hemoglobin 9.1. Other laboratory data with potassium 3.1, otherwise electrolytes normal. White count ranged from 17 to 13 through the hospital admission.  Hemoglobin on admission was 9.1 and went to a low of 8.2, and was 11 at the  time of discharge.  TIBC 146, 30% sats.  Folate 10.6  HIV nonreactive.  Toxicology screen done 48 hours after admission was positive for opiates, otherwise negative.  Urinalysis negative.  The patient was O-positive.  Blood cultures x 4 done twice during hospitalization negative.  Gram stain and culture of the abscesses areas grew out moderate Staph.  RADIOGRAPHIC DATA:  Bilateral shoulders negative.  Left hand osteopenia.  Left knee negative.  Right knee loss of joint  space, medially and laterally fusion. CT of the right upper extremity showed swollen right biceps region.  CT of the chest showed improved aeration of the right base compatible with infiltrate, negative for PE.  CT of the abdomen showed gallstones underlying pelvis, extensive left gluteal and posterior thigh abscess measuring 4.5 cm.  After PICC line placement, chest x-ray showed central catheter in satisfactory position.  Transthoracic echocardiogram showed normal aortic valve, left ventricular systolic function vigorous, right ventricle atrium mildly dilated. Difficult study.  TEE showed left ventricular systolic function normal.  Left ventricular ejection fraction 55-65%, mild reduced R-V function catheter, no intracardiac shunt.  No sign of vegetation on the valve.  HOSPITAL COURSE:  The patient was admitted for diagnosis and treatment of her arthralgias.  Initially thought to be arthritic in setting of elevated white count, and incision and drainage of abscesses.  She was discovered to have numerous Staph abscesses.  She denied vigorously throughout the hospitalization IV drug usage.  She was seen in consultation by orthopedics, infectious disease, critical care, and rheumatology with the thought that her findings were most consistent with her infection and multiple abscesses which could have resulted from her chronic prednisone usage.  It is significant that she was noted to have opiates on her toxicology screen.  She was treated with surgical I&D and debridement as needed.  She was begun on Ancef with rifampin added as cultures were obtained.  Ten days after hospitalization, more vigorous PT and OT were started, and there was concern that she was developing flexion contractures, particularly of her elbows, right knee, and hand.   Discharge is planned out of the acute hospital setting into a setting where she can receive continued rehabilitation of her joints including  daily physical therapy, occupational therapy, whirlpool therapy.  Because of the seriousness of her infection, it is recommended that her IV antibiotics be continued for four to six weeks, which would be mid to late June.  She has a PICC line for this.  MEDICATIONS ON DISCHARGE:  Pepcid 20 b.i.d., Diflucan one q.d., ____ heparin to prevent DVT which could be stopped when she became more ambulatory, Ancef 2 g IV q.d., rifampin 300 b.i.d., and prednisone 5 b.i.d.  CONDITION AT TIME OF ANTICIPATED DISCHARGE:  The patient is alert and beginning to cooperate fully with physical and occupational therapy services. Given her young age, it is hopeful that vigorous rehabilitation will restore her to her previous state. DD:  05/01/01 TD:  05/01/01 Job: [id number]

## 2011-08-30 ENCOUNTER — Other Ambulatory Visit: Payer: Self-pay | Admitting: Obstetrics & Gynecology

## 2011-08-30 DIAGNOSIS — Z1231 Encounter for screening mammogram for malignant neoplasm of breast: Secondary | ICD-10-CM

## 2011-09-07 ENCOUNTER — Ambulatory Visit (HOSPITAL_COMMUNITY): Payer: Self-pay | Attending: Obstetrics & Gynecology

## 2012-06-08 ENCOUNTER — Emergency Department (HOSPITAL_COMMUNITY): Payer: Medicare Other

## 2012-06-08 ENCOUNTER — Emergency Department (HOSPITAL_COMMUNITY)
Admission: EM | Admit: 2012-06-08 | Discharge: 2012-06-08 | Disposition: A | Discharge status: Home / Self Care | Payer: Medicare Other | Attending: Emergency Medicine | Admitting: Emergency Medicine

## 2012-06-08 ENCOUNTER — Encounter (HOSPITAL_COMMUNITY): Payer: Self-pay | Admitting: *Deleted

## 2012-06-08 DIAGNOSIS — M25649 Stiffness of unspecified hand, not elsewhere classified: Secondary | ICD-10-CM

## 2012-06-08 HISTORY — DX: Unspecified osteoarthritis, unspecified site: M19.90

## 2012-06-08 MED ORDER — NAPROXEN 500 MG PO TABS: 500.0000 mg | ORAL_TABLET | Freq: Two times a day (BID) | ORAL | Status: AC | PRN

## 2012-06-08 NOTE — ED Provider Notes (Signed)
Medical screening examination/treatment/procedure(s) were performed by non-physician practitioner and as supervising physician I was immediately available for consultation/collaboration.    Vida Roller, MD 06/08/12 [phone]

## 2012-06-08 NOTE — ED Provider Notes (Signed)
History     CSN: [id number]  Arrival date & time 06/08/12  4098   None     Chief Complaint  Patient presents with  . Hand Pain  . Joint Swelling    (Consider location/radiation/quality/duration/timing/severity/associated sxs/prior treatment) HPI Comments: Patient with hx rheumatoid arthritis reports she noticed swelling in her left index finger two days ago.  States she has stiffness in her 2nd MCP joint. Pain is 3/10.   Thinks she might have been bitten by something but denies itching, denies seeing any insects around her.  Denies fevers, injury, redness, weakness or numbness.  Pt has not had RA flare in many years, never previously had pain in her hands.  Is not currently taking any medications for RA.   The history is provided by the patient.    Past Medical History  Diagnosis Date  . Arthritis     History reviewed. No pertinent past surgical history.  History reviewed. No pertinent family history.  History  Substance Use Topics  . Smoking status: Never Smoker   . Smokeless tobacco: Not on file  . Alcohol Use: Yes    OB History    Grav Para Term Preterm Abortions TAB SAB Ect Mult Living                  Review of Systems  Constitutional: Negative for fever and chills.  Musculoskeletal: Positive for joint swelling.  Skin: Negative for color change, rash and wound.  Neurological: Negative for weakness and numbness.    Allergies  Review of patient's allergies indicates no known allergies.  Home Medications   Current Outpatient Rx  Name Route Sig Dispense Refill  . IBUPROFEN 200 MG PO TABS Oral Take 200-400 mg by mouth every 6 (six) hours as needed. Cramps or pain      BP 166/98  Pulse 95  Temp 97.7 F (36.5 C) (Oral)  Resp 16  SpO2 99%  Physical Exam  Nursing note and vitals reviewed. Constitutional: She is oriented to person, place, and time. She appears well-developed and well-nourished. No distress.  HENT:  Head: Normocephalic and atraumatic.    Neck: Neck supple.  Pulmonary/Chest: Effort normal.  Musculoskeletal:       Left wrist: Normal.       Left hand: She exhibits decreased range of motion, tenderness and swelling. She exhibits normal capillary refill, no deformity and no laceration. normal sensation noted. Normal strength noted.       Hands:      Left hand capillary refill < 2 seconds in all digits.  Pt able to move all digits (though 2nd MCP movement is limited, pt is able to bend and flex).  Radial pulse is intact.  Sensation intact, strength intact.    Neurological: She is alert and oriented to person, place, and time.  Skin: She is not diaphoretic.    ED Course  Procedures (including critical care time)  Labs Reviewed - No data to display Dg Hand Complete Left  06/08/2012  *RADIOLOGY REPORT*  Clinical Data: Hand swelling  LEFT HAND - COMPLETE 3+ VIEW  Comparison: None.  Findings: No acute fracture or dislocation.  No aggressive osseous lesions.  No radiopaque foreign body.  There is mild soft tissue swelling along the dorsum of the hand.  IMPRESSION: No acute osseous abnormality identified. Mild dorsal soft tissue swelling is nonspecific.  If clinical concern for early osteomyelitis is present, consider MRI.  Original Report Authenticated By: Waneta Martins, M.D.  1. Joint stiffness of hand       MDM  Patient with hx RA presents with swelling and stiffness in her left 2nd MCP joint.  She is afebrile, nontoxic, no evidence of septic joint.  Possible RA flare.  Pain is minimal.  Pt d/c home with NSAIDs, rheumatology follow up.  Discussed return precautions.  Patient verbalizes understanding and agrees with plan.          Dillard Cannon [place], [place] 06/08/12 [phone]

## 2012-06-08 NOTE — ED Notes (Signed)
Pt states that she woke up with left hand swelling. Pt states she is unsure if something bite her. Pt states that she was recently seen by PCP and was told she had High BP, pt has been watching sodium intake. Pt states able to bend right hand normally some difficulty with bending left hand.

## 2013-02-26 ENCOUNTER — Ambulatory Visit: Payer: Self-pay | Admitting: Obstetrics & Gynecology

## 2013-03-11 ENCOUNTER — Telehealth: Payer: Self-pay | Admitting: *Deleted

## 2013-03-11 DIAGNOSIS — IMO0001 Reserved for inherently not codable concepts without codable children: Secondary | ICD-10-CM

## 2013-03-11 MED ORDER — NORETHINDRONE 0.35 MG PO TABS: 1.0000 | ORAL_TABLET | Freq: Every day | ORAL | Status: DC

## 2013-03-11 NOTE — Telephone Encounter (Signed)
Patient called regarding a refill on her birth control.  Patient is due for an annual exam (last pap smear 2010-normal).  Appointment scheduled for 06/11/13.  Script for [employer] sent to pharmacy with 3 refills.

## 2013-06-11 ENCOUNTER — Encounter: Payer: Self-pay | Admitting: Obstetrics & Gynecology

## 2013-06-11 ENCOUNTER — Ambulatory Visit (INDEPENDENT_AMBULATORY_CARE_PROVIDER_SITE_OTHER): Payer: Medicare Other | Admitting: Obstetrics & Gynecology

## 2013-06-11 VITALS — BP 132/90 | HR 80 | Temp 98.1°F | Ht 60.0 in | Wt 204.0 lb

## 2013-06-11 DIAGNOSIS — Z113 Encounter for screening for infections with a predominantly sexual mode of transmission: Secondary | ICD-10-CM

## 2013-06-11 DIAGNOSIS — N760 Acute vaginitis: Secondary | ICD-10-CM

## 2013-06-11 NOTE — Patient Instructions (Signed)

## 2013-06-11 NOTE — Progress Notes (Signed)
.   Subjective:     Kimberly Chavez is a 47 y.o. female here for a routine exam.  She would like all std testing, including blood work.  No current complaints.  Personal health questionnaire reviewed: no.   Gynecologic History Patient's last menstrual period was 06/01/2013.  Last Pap:2013 . Results were: normal Last mammogram: 2013. Results were: normal  Obstetric History OB History   Grav Para Term Preterm Abortions TAB SAB Ect Mult Living                   The following portions of the patient's history were reviewed and updated as appropriate: allergies, current medications, past family history, past medical history, past social history, past surgical history and problem list.  Review of Systems Pertinent items are noted in HPI.    Objective:    . General:  alert     Abdomen: soft, non-tender; bowel sounds normal; no masses,  no organomegaly   Vulva:  normal  Vagina: normal vagina  Cervix:  no lesions  Corpus: normal size, contour, position, consistency, mobility, non-tender  Adnexa:  normal adnexa    Assessment:    Healthy female exam.    Plan:    Screening for STI Return prn

## 2013-06-12 LAB — GC/CHLAMYDIA PROBE AMP
CT Probe RNA: NEGATIVE
GC Probe RNA: NEGATIVE

## 2013-07-14 ENCOUNTER — Other Ambulatory Visit: Payer: Self-pay | Admitting: *Deleted

## 2013-07-14 DIAGNOSIS — IMO0001 Reserved for inherently not codable concepts without codable children: Secondary | ICD-10-CM

## 2013-07-14 MED ORDER — NORETHINDRONE 0.35 MG PO TABS: 1.0000 | ORAL_TABLET | Freq: Every day | ORAL | Status: DC

## 2013-07-14 NOTE — Telephone Encounter (Signed)
Fax from pharmacy requesting RF- patient in office 05/2013

## 2014-03-30 IMAGING — CR DG HAND COMPLETE 3+V*L*
3 series · 3 of 3 positions shown · non-contrast
Comparison: None.

CLINICAL DATA: Hand swelling

LEFT HAND - COMPLETE 3+ VIEW

[x hand pa left]
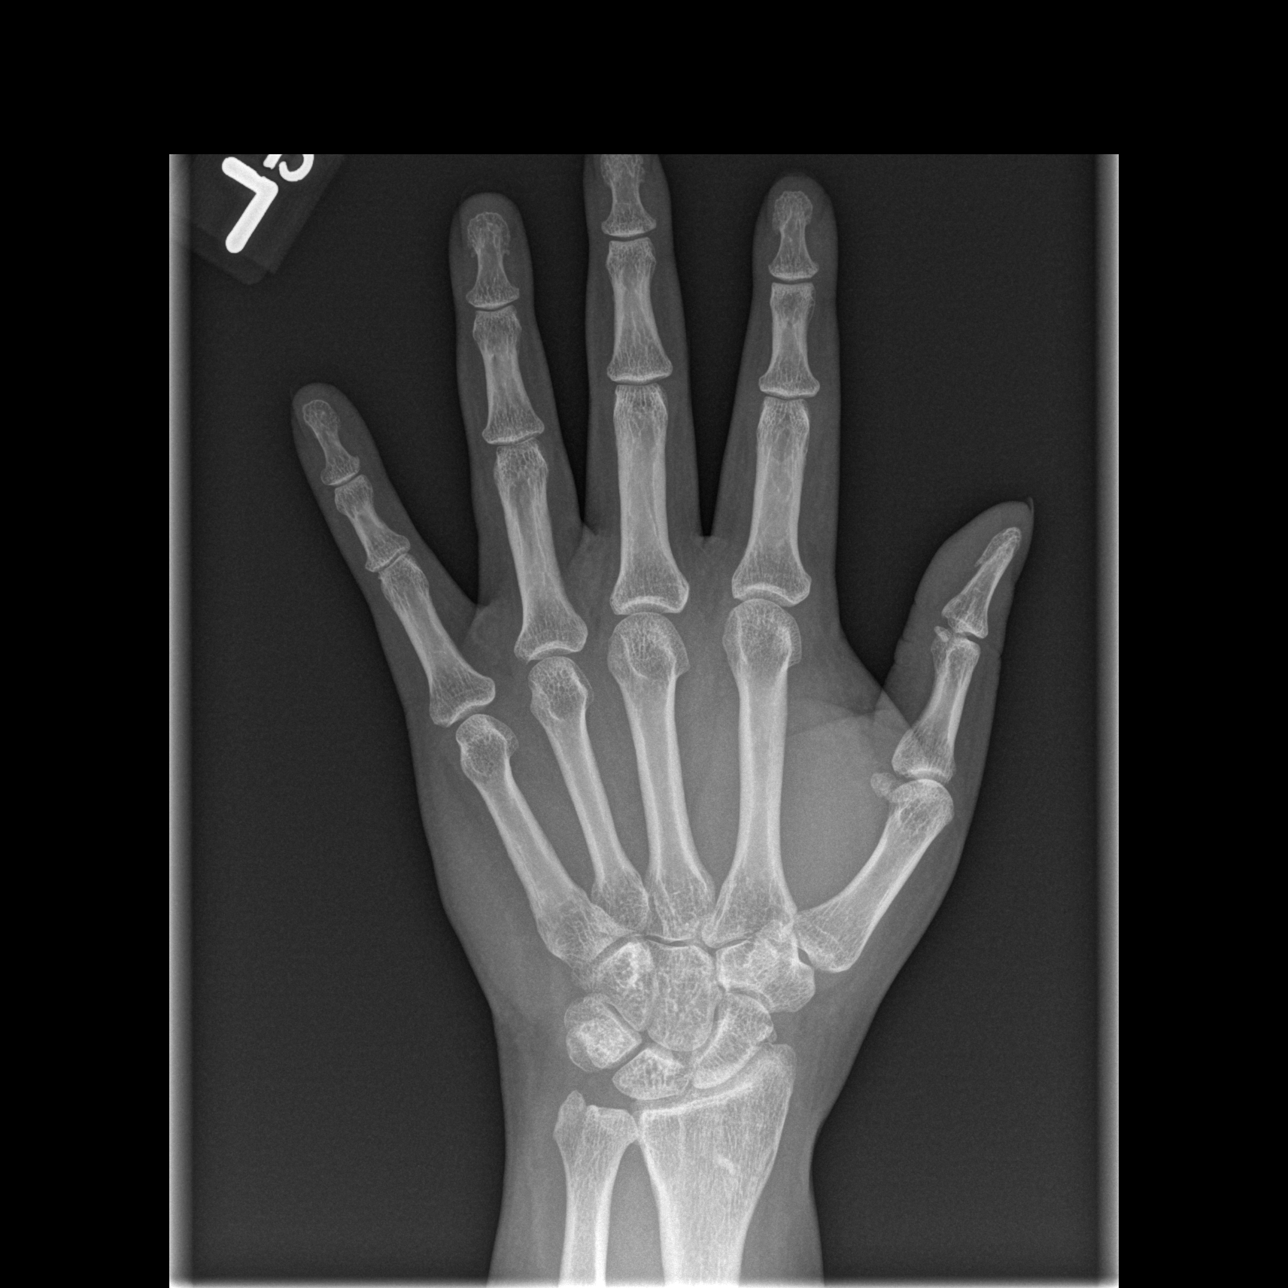

[x hand oblique left]
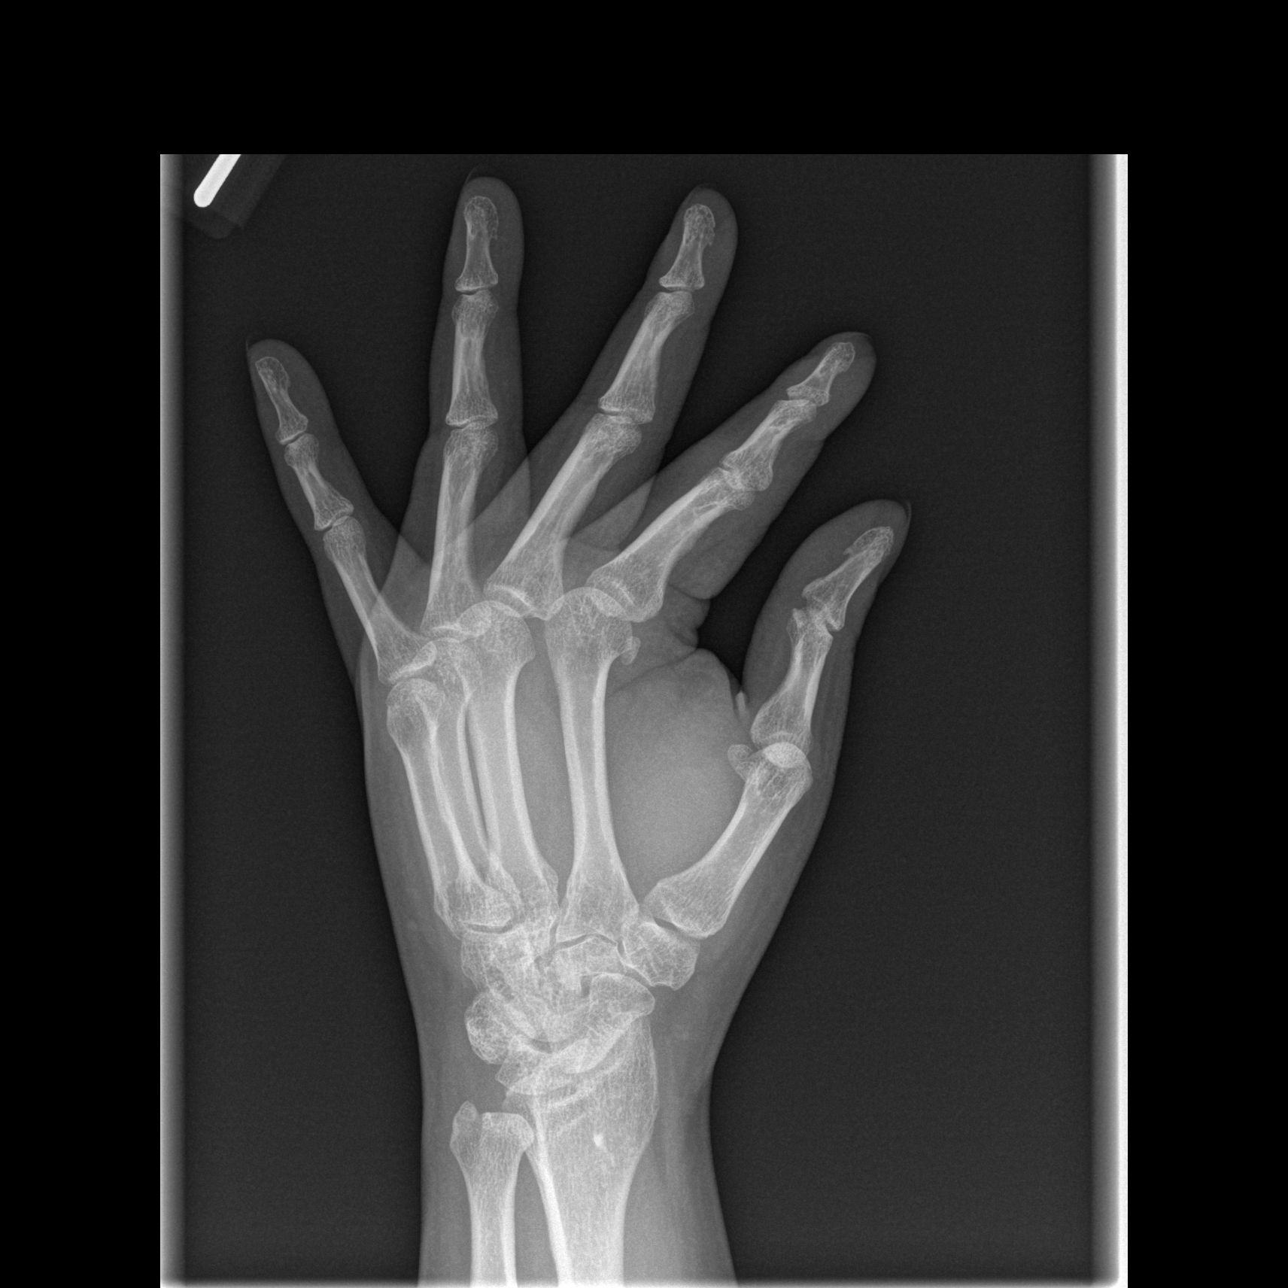

[x hand lat left]
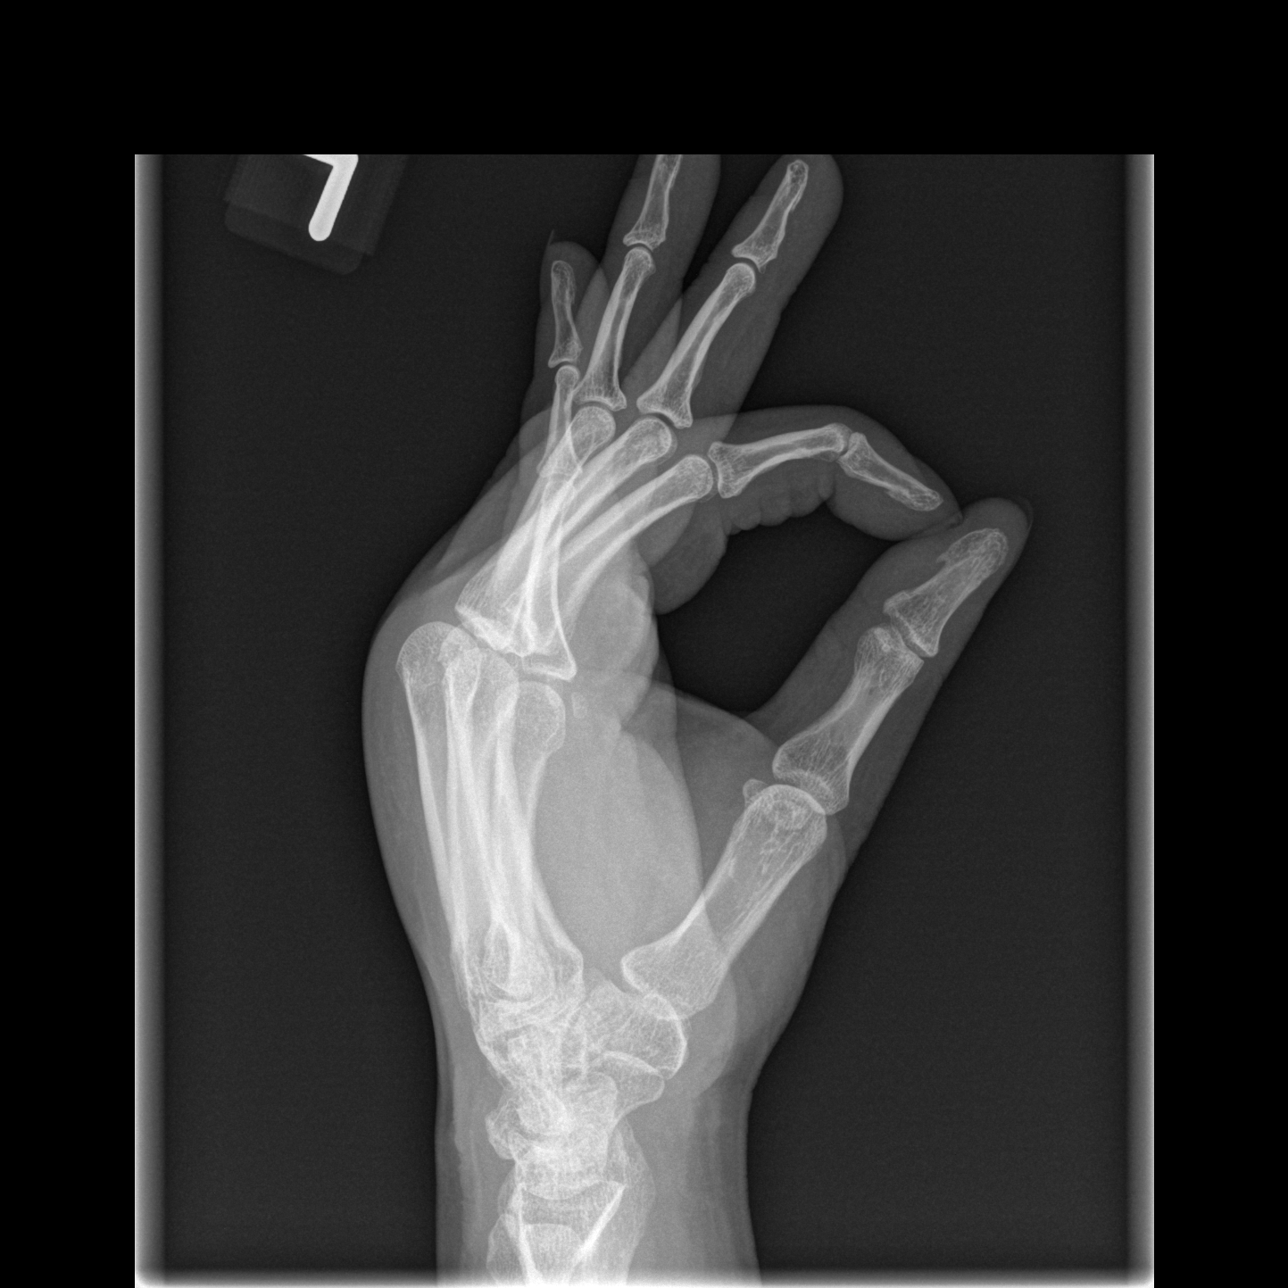

[3 of 3 positions shown; findings below may reference images not displayed]

FINDINGS: No acute fracture or dislocation.  No aggressive osseous
lesions.  No radiopaque foreign body.  There is mild soft tissue
swelling along the dorsum of the hand.
IMPRESSION: No acute osseous abnormality identified. Mild dorsal soft tissue
swelling is nonspecific.

If clinical concern for early osteomyelitis is present, consider
MRI.

## 2014-05-25 ENCOUNTER — Other Ambulatory Visit: Payer: Self-pay | Admitting: *Deleted

## 2014-05-25 MED ORDER — NORETHINDRONE 0.35 MG PO TABS: 1.0000 | ORAL_TABLET | Freq: Every day | ORAL | Status: DC

## 2014-05-25 NOTE — Progress Notes (Signed)
Pt called into office for refill of birth control.   Return call to pt to verify birth control.  Refill sent to pharmacy until AEX date can be schedule. Pt to be made aware.

## 2014-06-08 ENCOUNTER — Ambulatory Visit: Payer: Medicare Other | Admitting: Obstetrics & Gynecology

## 2014-06-11 ENCOUNTER — Ambulatory Visit: Payer: Medicare Other | Admitting: Obstetrics & Gynecology

## 2014-09-16 ENCOUNTER — Ambulatory Visit: Payer: Medicare Other | Admitting: Obstetrics & Gynecology

## 2014-09-17 ENCOUNTER — Other Ambulatory Visit: Payer: Self-pay | Admitting: *Deleted

## 2014-09-17 MED ORDER — NORETHINDRONE 0.35 MG PO TABS: 1.0000 | ORAL_TABLET | Freq: Every day | ORAL | Status: DC

## 2014-09-17 NOTE — Progress Notes (Signed)
Pt called to office requesting refill on birth control until she can have AEX.  Pt is scheduled for January 2016.   Pt made aware that refill would be sent.  Pt advised to keep appt for AEX as this may be the lase refill authorized.  Pt states understanding.

## 2014-11-04 ENCOUNTER — Encounter: Payer: Self-pay | Admitting: *Deleted

## 2014-11-09 ENCOUNTER — Other Ambulatory Visit: Payer: Self-pay | Admitting: *Deleted

## 2014-11-09 DIAGNOSIS — Z3041 Encounter for surveillance of contraceptive pills: Secondary | ICD-10-CM

## 2014-11-09 MED ORDER — NORETHINDRONE 0.35 MG PO TABS: 1.0000 | ORAL_TABLET | Freq: Every day | ORAL | Status: DC

## 2014-11-23 ENCOUNTER — Encounter: Payer: Self-pay | Admitting: *Deleted

## 2014-11-24 ENCOUNTER — Encounter: Payer: Self-pay | Admitting: Obstetrics & Gynecology

## 2014-12-02 ENCOUNTER — Ambulatory Visit: Payer: Medicare Other | Admitting: Obstetrics & Gynecology

## 2015-01-06 ENCOUNTER — Ambulatory Visit (INDEPENDENT_AMBULATORY_CARE_PROVIDER_SITE_OTHER): Payer: Medicare Other | Admitting: Certified Nurse Midwife

## 2015-01-06 ENCOUNTER — Encounter: Payer: Self-pay | Admitting: Certified Nurse Midwife

## 2015-01-06 VITALS — BP 137/93 | HR 94 | Temp 97.8°F | Wt 209.0 lb

## 2015-01-06 DIAGNOSIS — Z124 Encounter for screening for malignant neoplasm of cervix: Secondary | ICD-10-CM

## 2015-01-06 DIAGNOSIS — Z01419 Encounter for gynecological examination (general) (routine) without abnormal findings: Secondary | ICD-10-CM

## 2015-01-06 MED ORDER — IBUPROFEN 800 MG PO TABS: 800.0000 mg | ORAL_TABLET | Freq: Three times a day (TID) | ORAL | Status: DC | PRN

## 2015-01-07 ENCOUNTER — Encounter: Payer: Self-pay | Admitting: Certified Nurse Midwife

## 2015-01-07 NOTE — Progress Notes (Signed)
Patient ID: Kimberly Chavez, female   DOB: [birth year], 49 y.o.   MRN: [id number]  Subjective:     Kimberly Chavez is a 49 y.o. female here for a routine exam.  Current complaints: arthritis. No recent changes in her menstral history, still having monthly menses.  In a stable relationship with long term boyfriend.     Personal health questionnaire:  Is patient Ashkenazi Jewish, have a family history of breast and/or ovarian cancer: no Is there a family history of uterine cancer diagnosed at age < 41, gastrointestinal cancer, urinary tract cancer, family member who is a Field seismologist syndrome-associated carrier: no Is the patient overweight and hypertensive, family history of diabetes, personal history of gestational diabetes, preeclampsia or PCOS: no Is patient over 29, have PCOS,  family history of premature CHD under age 75, diabetes, smoke, have hypertension or peripheral artery disease:  no At any time, has a partner hit, kicked or otherwise hurt or frightened you?: no Over the past 2 weeks, have you felt down, depressed or hopeless?: no Over the past 2 weeks, have you felt little interest or pleasure in doing things?:no   Gynecologic History No LMP recorded. Contraception: tubal ligation Last [id number]. Results were: unknown Last mammogram: 2012?Marland Kitchen Not sure it was completed, records unavailable.  She has not had health insurance until recently as was not able to afford medical care.   Obstetric History G2P2  Past Medical History  Diagnosis Date  . Arthritis     Past Surgical History  Procedure Laterality Date  . Incision and drainage      Left forearm     Current outpatient prescriptions:  .  ibuprofen (ADVIL,MOTRIN) 800 MG tablet, Take 1 tablet (800 mg total) by mouth every 8 (eight) hours as needed. Start with the start of the next period and continue every 8 hours until menses stops., Disp: 30 tablet, Rfl: 0 .  norethindrone (MICRONOR,CAMILA,ERRIN) 0.35 MG tablet, Take 1 tablet  (0.35 mg total) by mouth daily. (Patient not taking: Reported on 01/06/2015), Disp: 1 Package, Rfl: 3 No Known Allergies  History  Substance Use Topics  . Smoking status: Never Smoker   . Smokeless tobacco: Not on file  . Alcohol Use: 0.0 oz/week    0 Standard drinks or equivalent per week    Family History  Problem Relation Age of Onset  . Cancer Mother       Review of Systems  Constitutional: negative for fatigue and weight loss Respiratory: negative for cough and wheezing Cardiovascular: negative for chest pain, fatigue and palpitations Gastrointestinal: negative for abdominal pain and change in bowel habits Musculoskeletal:postitive for myalgias, hx of arthritis Neurological: negative for gait problems and tremors Behavioral/Psych: negative for abusive relationship, depression Endocrine: negative for temperature intolerance   Genitourinary:negative for abnormal menstrual periods, genital lesions, hot flashes, sexual problems and vaginal discharge Integument/breast: negative for breast lump, breast tenderness, nipple discharge and skin lesion(s)    Objective:       BP 137/93 mmHg  Pulse 94  Temp(Src) 97.8 F (36.6 C)  Wt 94.802 kg (209 lb) General:   alert  Skin:   no rash or abnormalities  Lungs:   clear to auscultation bilaterally  Heart:   regular rate and rhythm, S1, S2 normal, no murmur, click, rub or gallop  Breasts:   normal without suspicious masses, skin or nipple changes or axillary nodes  Abdomen:  normal findings: no organomegaly, soft, non-tender and no hernia  Pelvis:  External genitalia: normal general appearance Urinary system:  urethral meatus normal and bladder without fullness, nontender Vaginal: normal without tenderness, induration or masses Cervix: normal appearance Adnexa: normal bimanual exam Uterus: anteverted and non-tender, normal size   Lab Review Labs reviewed yes   Assessment:    Healthy female exam.    Plan:    Education  reviewed: low fat, low cholesterol diet, self breast exams and weight bearing exercise. Mammogram not able to be ordered due to insurance coverage.   Meds ordered this encounter  Medications  . ibuprofen (ADVIL,MOTRIN) 800 MG tablet    Sig: Take 1 tablet (800 mg total) by mouth every 8 (eight) hours as needed. Start with the start of the next period and continue every 8 hours until menses stops.    Dispense:  30 tablet    Refill:  0   Orders Placed This Encounter  Procedures  . SureSwab, Vaginosis/Vaginitis Plus  . Ambulatory referral to Internal Medicine    Referral Priority:  Routine    Referral Type:  Consultation    Referral Reason:  Specialty Services Required    Requested Specialty:  Internal Medicine    Number of Visits Requested:  1   Need to obtain previous records Follow up as needed, return visit for B/P check in 2 weeks. Annual exam.

## 2015-01-11 LAB — SURESWAB, VAGINOSIS/VAGINITIS PLUS
Atopobium vaginae: 7.2 Log (cells/mL)
C. albicans, DNA: NOT DETECTED
C. glabrata, DNA: NOT DETECTED
C. parapsilosis, DNA: NOT DETECTED
C. trachomatis RNA, TMA: NOT DETECTED
C. tropicalis, DNA: NOT DETECTED
Gardnerella vaginalis: 7.4 Log (cells/mL)
LACTOBACILLUS SPECIES: NOT DETECTED Log (cells/mL)
MEGASPHAERA SPECIES: 7.9 Log (cells/mL)
N. gonorrhoeae RNA, TMA: NOT DETECTED
T. vaginalis RNA, QL TMA: NOT DETECTED

## 2015-01-12 ENCOUNTER — Other Ambulatory Visit: Payer: Self-pay | Admitting: Certified Nurse Midwife

## 2015-01-12 LAB — PAP IG AND HPV HIGH-RISK: HPV DNA High Risk: NOT DETECTED

## 2015-01-13 ENCOUNTER — Other Ambulatory Visit: Payer: Self-pay | Admitting: *Deleted

## 2015-01-13 DIAGNOSIS — B3731 Acute candidiasis of vulva and vagina: Secondary | ICD-10-CM

## 2015-01-13 DIAGNOSIS — B9689 Other specified bacterial agents as the cause of diseases classified elsewhere: Secondary | ICD-10-CM

## 2015-01-13 DIAGNOSIS — N760 Acute vaginitis: Principal | ICD-10-CM

## 2015-01-13 DIAGNOSIS — B373 Candidiasis of vulva and vagina: Secondary | ICD-10-CM

## 2015-01-13 MED ORDER — METRONIDAZOLE 500 MG PO TABS: 500.0000 mg | ORAL_TABLET | Freq: Two times a day (BID) | ORAL | Status: DC

## 2015-01-13 MED ORDER — FLUCONAZOLE 150 MG PO TABS: 150.0000 mg | ORAL_TABLET | Freq: Every day | ORAL | Status: DC

## 2015-01-29 ENCOUNTER — Telehealth: Payer: Self-pay | Admitting: *Deleted

## 2015-01-29 NOTE — Telephone Encounter (Signed)
Pt called to office and spoke with Call Center.  Pt states that she received letter in mail and would like a return call.  Return call to pt.  Pt states that she has letter stating her pap smear was normal.  Pt states she was unsure if she needed to do anything further. Pt advised that she may f/u as needed.

## 2015-02-23 ENCOUNTER — Other Ambulatory Visit: Payer: Self-pay | Admitting: Certified Nurse Midwife

## 2015-02-23 ENCOUNTER — Telehealth: Payer: Self-pay | Admitting: *Deleted

## 2015-02-23 DIAGNOSIS — Z3041 Encounter for surveillance of contraceptive pills: Secondary | ICD-10-CM

## 2015-02-23 MED ORDER — NORETHINDRONE 0.35 MG PO TABS: 1.0000 | ORAL_TABLET | Freq: Every day | ORAL | Status: DC

## 2015-02-23 NOTE — Telephone Encounter (Signed)
Please reorder her norethindrone (MICRONOR,CAMILA,ERRIN) 0.35 MG tablet  Take 1 tablet (0.35 mg total) by mouth daily., Disp-1 Package, 12 Refills.   Thank you.  It still is not letting me do this in the system.

## 2015-02-23 NOTE — Telephone Encounter (Signed)
Patient called today and would like to know if Ernst Bowler is going to refill her birth control. Please call her and let her know.

## 2015-02-24 ENCOUNTER — Other Ambulatory Visit: Payer: Self-pay | Admitting: *Deleted

## 2015-02-24 DIAGNOSIS — Z3041 Encounter for surveillance of contraceptive pills: Secondary | ICD-10-CM

## 2015-02-24 DIAGNOSIS — B373 Candidiasis of vulva and vagina: Secondary | ICD-10-CM

## 2015-02-24 DIAGNOSIS — B3731 Acute candidiasis of vulva and vagina: Secondary | ICD-10-CM

## 2015-02-24 MED ORDER — NORETHINDRONE 0.35 MG PO TABS: 1.0000 | ORAL_TABLET | Freq: Every day | ORAL | Status: DC

## 2015-02-24 NOTE — Telephone Encounter (Signed)
Medication ordered & patient notified 

## 2016-01-10 ENCOUNTER — Ambulatory Visit: Payer: Medicare Other | Admitting: Obstetrics

## 2016-01-17 ENCOUNTER — Telehealth: Payer: Self-pay | Admitting: *Deleted

## 2016-01-17 NOTE — Telephone Encounter (Signed)
Patient wants a refill on her birth control pills- can we refill?

## 2016-01-18 ENCOUNTER — Other Ambulatory Visit: Payer: Self-pay | Admitting: Certified Nurse Midwife

## 2016-01-18 NOTE — Telephone Encounter (Signed)
Patient needs f/u appointment d/t hx of HTN.  Thank you.  R.Denney CNM

## 2016-01-21 ENCOUNTER — Ambulatory Visit (INDEPENDENT_AMBULATORY_CARE_PROVIDER_SITE_OTHER): Payer: Medicare Other | Admitting: Certified Nurse Midwife

## 2016-01-21 ENCOUNTER — Encounter: Payer: Self-pay | Admitting: Certified Nurse Midwife

## 2016-01-21 VITALS — BP 131/84 | HR 88 | Ht 60.0 in | Wt 216.0 lb

## 2016-01-21 DIAGNOSIS — Z3041 Encounter for surveillance of contraceptive pills: Secondary | ICD-10-CM

## 2016-01-21 DIAGNOSIS — N946 Dysmenorrhea, unspecified: Secondary | ICD-10-CM

## 2016-01-21 MED ORDER — NORETHINDRONE 0.35 MG PO TABS: 1.0000 | ORAL_TABLET | Freq: Every day | ORAL | Status: DC

## 2016-01-21 MED ORDER — IBUPROFEN 800 MG PO TABS: 800.0000 mg | ORAL_TABLET | Freq: Three times a day (TID) | ORAL | Status: AC | PRN

## 2016-01-21 NOTE — Telephone Encounter (Signed)
Appointment 01/21/2016

## 2016-01-21 NOTE — Progress Notes (Signed)
Patient ID: Kimberly Chavez, female   DOB: 1966-10-31, 50 y.o.   MRN: [id number]  Chief Complaint  Patient presents with  . Follow-up    BP check for birth control refill    HPI Kimberly Chavez is a 50 y.o. female.  Here for f/u on contraception management. Has been taking POP for years, desires to continue.  Other methods discussed.  Her periods range from brown spotting to heavy periods lasting 5-7 days.  Denies any clots.  Denies any menopausal symptoms.  Is on medicare/medicaid discussed options of LoLo and sprintec.  Patient desires to keep POP for now.  Does have severe dysmenorrhea.  Surveillance of blood pressure: stable today.  Denies any smoking.     HPI  Past Medical History  Diagnosis Date  . Arthritis     Past Surgical History  Procedure Laterality Date  . Incision and drainage      Left forearm    Family History  Problem Relation Age of Onset  . Cancer Mother     Social History Social History  Substance Use Topics  . Smoking status: Never Smoker   . Smokeless tobacco: None  . Alcohol Use: 0.0 oz/week    0 Standard drinks or equivalent per week    No Known Allergies  Current Outpatient Prescriptions  Medication Sig Dispense Refill  . ibuprofen (ADVIL,MOTRIN) 800 MG tablet Take 1 tablet (800 mg total) by mouth every 8 (eight) hours as needed for cramping. 90 tablet 6  . norethindrone (MICRONOR,CAMILA,ERRIN) 0.35 MG tablet Take 1 tablet (0.35 mg total) by mouth daily. 1 Package 11   No current facility-administered medications for this visit.    Review of Systems Review of Systems Constitutional: negative for fatigue and weight loss Respiratory: negative for cough and wheezing Cardiovascular: negative for chest pain, fatigue and palpitations Gastrointestinal: negative for abdominal pain and change in bowel habits Genitourinary:negative Integument/breast: negative for nipple discharge Musculoskeletal:negative for myalgias Neurological: negative for  gait problems and tremors Behavioral/Psych: negative for abusive relationship, depression Endocrine: negative for temperature intolerance     Blood pressure 131/84, pulse 88, height 5' (1.524 m), weight 216 lb (97.977 kg), last menstrual period 01/09/2016.  Physical Exam Physical Exam: deferred General:   alert    100% of 15 min visit spent on counseling and coordination of care.   Data Reviewed Previous medical hx, meds  Assessment     Contraception management Dysmenorrhea     Plan    No orders of the defined types were placed in this encounter.   Meds ordered this encounter  Medications  . norethindrone (MICRONOR,CAMILA,ERRIN) 0.35 MG tablet    Sig: Take 1 tablet (0.35 mg total) by mouth daily.    Dispense:  1 Package    Refill:  11  . ibuprofen (ADVIL,MOTRIN) 800 MG tablet    Sig: Take 1 tablet (800 mg total) by mouth every 8 (eight) hours as needed for cramping.    Dispense:  90 tablet    Refill:  6    Possible management options include: LoLoestrin, [place], Mirena IUD Follow up as needed.

## 2016-12-01 ENCOUNTER — Telehealth: Payer: Self-pay | Admitting: *Deleted

## 2016-12-01 ENCOUNTER — Other Ambulatory Visit: Payer: Self-pay | Admitting: Certified Nurse Midwife

## 2016-12-01 DIAGNOSIS — Z3041 Encounter for surveillance of contraceptive pills: Secondary | ICD-10-CM

## 2016-12-01 MED ORDER — NORETHINDRONE 0.35 MG PO TABS: 1.0000 | ORAL_TABLET | Freq: Every day | ORAL | 11 refills | Status: DC

## 2016-12-01 NOTE — Telephone Encounter (Signed)
Patient is requesting a refill of her birth control pills. She is due her annual exam in February.

## 2016-12-01 NOTE — Telephone Encounter (Signed)
Please let her know that the refill has been sent in.  Thank you. R.Timmy Cleverly CNM

## 2016-12-06 NOTE — Telephone Encounter (Signed)
Patient notified

## 2017-10-04 ENCOUNTER — Telehealth: Payer: Self-pay | Admitting: *Deleted

## 2017-10-04 ENCOUNTER — Other Ambulatory Visit: Payer: Self-pay | Admitting: *Deleted

## 2017-10-04 DIAGNOSIS — Z3041 Encounter for surveillance of contraceptive pills: Secondary | ICD-10-CM

## 2017-10-04 MED ORDER — NORETHINDRONE 0.35 MG PO TABS: 1.0000 | ORAL_TABLET | Freq: Every day | ORAL | 0 refills | Status: DC

## 2017-10-04 NOTE — Telephone Encounter (Signed)
Rx sent 

## 2017-10-04 NOTE — Progress Notes (Signed)
See phone note

## 2017-10-04 NOTE — Telephone Encounter (Signed)
Please send in a pack to get her through until the next exam.  Thank you.  Toivo Bordon

## 2017-10-04 NOTE — Telephone Encounter (Signed)
Pt called to office for refill of medication.  Return call to pt.  Pt states she needs refill on her BC pill, she only has one left. Pt has been scheduled for an AEX at end of this month since she has not been seen in over 2 years.  Pt made aware will message provider about refill.   Please send refill to [place] if approved.

## 2017-10-24 ENCOUNTER — Ambulatory Visit (INDEPENDENT_AMBULATORY_CARE_PROVIDER_SITE_OTHER): Payer: Medicare HMO | Admitting: Certified Nurse Midwife

## 2017-10-24 ENCOUNTER — Other Ambulatory Visit (HOSPITAL_COMMUNITY)
Admission: RE | Admit: 2017-10-24 | Discharge: 2017-10-24 | Disposition: A | Discharge status: Home / Self Care | Payer: Medicare HMO | Source: Ambulatory Visit | Attending: Certified Nurse Midwife | Admitting: Certified Nurse Midwife

## 2017-10-24 ENCOUNTER — Encounter: Payer: Self-pay | Admitting: Certified Nurse Midwife

## 2017-10-24 VITALS — BP 168/95 | HR 101 | Ht 60.0 in | Wt 220.4 lb

## 2017-10-24 DIAGNOSIS — N898 Other specified noninflammatory disorders of vagina: Secondary | ICD-10-CM | POA: Insufficient documentation

## 2017-10-24 DIAGNOSIS — Z124 Encounter for screening for malignant neoplasm of cervix: Secondary | ICD-10-CM | POA: Diagnosis not present

## 2017-10-24 DIAGNOSIS — Z01419 Encounter for gynecological examination (general) (routine) without abnormal findings: Secondary | ICD-10-CM

## 2017-10-24 DIAGNOSIS — N926 Irregular menstruation, unspecified: Secondary | ICD-10-CM | POA: Diagnosis present

## 2017-10-24 DIAGNOSIS — Z3041 Encounter for surveillance of contraceptive pills: Secondary | ICD-10-CM

## 2017-10-24 MED ORDER — NORETHINDRONE 0.35 MG PO TABS: 1.0000 | ORAL_TABLET | Freq: Every day | ORAL | 12 refills | Status: DC

## 2017-10-24 NOTE — Progress Notes (Signed)
Subjective:        Kimberly Chavez is a 51 y.o. female here for a routine exam.  Current complaints: arthritis. No recent changes in her menstral history, still having menses, have been irregular from July to October.  In a stable relationship with long term boyfriend.   Declines STD blood testing.  Does want swab for STIs.   Personal health questionnaire:  Is patient Ashkenazi Jewish, have a family history of breast and/or ovarian cancer: no Is there a family history of uterine cancer diagnosed at age < 43, gastrointestinal cancer, urinary tract cancer, family member who is a Field seismologist syndrome-associated carrier: no Is the patient overweight and hypertensive, family history of diabetes, personal history of gestational diabetes, preeclampsia or PCOS: no Is patient over 8, have PCOS,  family history of premature CHD under age 68, diabetes, smoke, have hypertension or peripheral artery disease:  no At any time, has a partner hit, kicked or otherwise hurt or frightened you?: no Over the past 2 weeks, have you felt down, depressed or hopeless?: no Over the past 2 weeks, have you felt little interest or pleasure in doing things?:no   Gynecologic History No LMP recorded. Contraception: tubal ligation Last Pap: 01/06/15. Results were: normal Last mammogram: this year per patient at [facility]ography. Results were: normal according to the patient.   Obstetric History OB History  No data available    Past Medical History:  Diagnosis Date  . Arthritis     Past Surgical History:  Procedure Laterality Date  . INCISION AND DRAINAGE     Left forearm     Current Outpatient Medications:  .  ibuprofen (ADVIL,MOTRIN) 800 MG tablet, Take 1 tablet (800 mg total) by mouth every 8 (eight) hours as needed for cramping., Disp: 90 tablet, Rfl: 6 .  norethindrone (MICRONOR,CAMILA,ERRIN) 0.35 MG tablet, Take 1 tablet (0.35 mg total) by mouth daily., Disp: 1 Package, Rfl: 12 No Known Allergies   Social History   Tobacco Use  . Smoking status: Never Smoker  . Smokeless tobacco: Never Used  Substance Use Topics  . Alcohol use: Yes    Alcohol/week: 0.0 oz    Family History  Problem Relation Age of Onset  . Cancer Mother       Review of Systems  Constitutional: negative for fatigue and weight loss Respiratory: negative for cough and wheezing Cardiovascular: negative for chest pain, fatigue and palpitations Gastrointestinal: negative for abdominal pain and change in bowel habits Musculoskeletal:negative for myalgias Neurological: negative for gait problems and tremors Behavioral/Psych: negative for abusive relationship, depression Endocrine: negative for temperature intolerance    Genitourinary:negative for abnormal menstrual periods, genital lesions, hot flashes, sexual problems and vaginal discharge Integument/breast: negative for breast lump, breast tenderness, nipple discharge and skin lesion(s)    Objective:       BP (!) 168/95   Pulse (!) 101   Ht 5' (1.524 m)   Wt 220 lb 6.4 oz (100 kg)   BMI 43.04 kg/m  General:   alert  Skin:   no rash or abnormalities  Lungs:   clear to auscultation bilaterally  Heart:   regular rate and rhythm, S1, S2 normal, no murmur, click, rub or gallop  Breasts:   normal without suspicious masses, skin or nipple changes or axillary nodes  Abdomen:  normal findings: no organomegaly, soft, non-tender and no hernia obese  Pelvis:  External genitalia: normal general appearance Urinary system: urethral meatus normal and bladder without fullness, nontender Vaginal: normal without tenderness,  induration or masses Cervix: normal appearance Adnexa: normal bimanual exam Uterus: retroverted and non-tender, slightly enlarged size   Lab Review Urine pregnancy test Labs reviewed yes Radiologic studies reviewed no  50% of 30 min visit spent on counseling and coordination of care.   Assessment & Plan    Healthy female exam.    1.  Well woman exam    Slightly enlarged uterus, [place] ordered - Cytology - PAP  2. Irregular menstrual bleeding   - US PELVIC COMPLETE WITH TRANSVAGINAL; Future  3. Encounter for surveillance of contraceptive pills    - norethindrone (MICRONOR,CAMILA,ERRIN) 0.35 MG tablet; Take 1 tablet (0.35 mg total) by mouth daily.  Dispense: 1 Package; Refill: 12   Education reviewed: calcium supplements, depression evaluation, low fat, low cholesterol diet, safe sex/STD prevention, self breast exams, skin cancer screening, weight bearing exercise and elevated blood pressure, encouraged f/u with PCP. Follow up in: 1 year.   Meds ordered this encounter  Medications  . norethindrone (MICRONOR,CAMILA,ERRIN) 0.35 MG tablet    Sig: Take 1 tablet (0.35 mg total) by mouth daily.    Dispense:  1 Package    Refill:  12   Orders Placed This Encounter  Procedures  . US PELVIC COMPLETE WITH TRANSVAGINAL    Standing Status:   Future    Standing Expiration Date:   12/24/2018    Order Specific Question:   Reason for Exam (SYMPTOM  OR DIAGNOSIS REQUIRED)    Answer:   AUB    Order Specific Question:   Preferred imaging location?    Answer:   [facility]   Follow up as needed.

## 2017-10-24 NOTE — Addendum Note (Signed)
Addended by: Kandis Cocking A on: 10/24/2017 03:50 PM   Modules accepted: Orders

## 2017-10-25 LAB — CERVICOVAGINAL ANCILLARY ONLY
BACTERIAL VAGINITIS: NEGATIVE
CHLAMYDIA, DNA PROBE: NEGATIVE
Candida vaginitis: NEGATIVE
NEISSERIA GONORRHEA: NEGATIVE
TRICH (WINDOWPATH): NEGATIVE

## 2017-10-29 LAB — CYTOLOGY - PAP
Adequacy: ABSENT
DIAGNOSIS: NEGATIVE
HPV: NOT DETECTED

## 2017-10-31 ENCOUNTER — Ambulatory Visit (HOSPITAL_COMMUNITY)
Admission: RE | Admit: 2017-10-31 | Discharge: 2017-10-31 | Disposition: A | Discharge status: Home / Self Care | Payer: Medicare HMO | Source: Ambulatory Visit | Attending: Certified Nurse Midwife | Admitting: Certified Nurse Midwife

## 2017-10-31 DIAGNOSIS — D259 Leiomyoma of uterus, unspecified: Secondary | ICD-10-CM | POA: Diagnosis present

## 2017-10-31 DIAGNOSIS — N926 Irregular menstruation, unspecified: Secondary | ICD-10-CM

## 2017-11-28 ENCOUNTER — Encounter: Payer: Self-pay | Admitting: Obstetrics and Gynecology

## 2017-11-28 ENCOUNTER — Ambulatory Visit (INDEPENDENT_AMBULATORY_CARE_PROVIDER_SITE_OTHER): Payer: Medicare HMO | Admitting: Obstetrics and Gynecology

## 2017-11-28 VITALS — BP 168/103 | HR 112 | Wt 219.5 lb

## 2017-11-28 DIAGNOSIS — D252 Subserosal leiomyoma of uterus: Secondary | ICD-10-CM

## 2017-11-28 DIAGNOSIS — Z712 Person consulting for explanation of examination or test findings: Secondary | ICD-10-CM

## 2017-11-28 NOTE — Progress Notes (Signed)
52 yo here to discuss results of her recent ultrasound. Patient reports feeling well. She denies any pelvic pain, abnormal discharge or back pain. She is sexually active using Micronor for contraception. She reports a light period lasting 3-4 days monthly.  Past Medical History:  Diagnosis Date  . Arthritis    Past Surgical History:  Procedure Laterality Date  . INCISION AND DRAINAGE     Left forearm   Family History  Problem Relation Age of Onset  . Cancer Mother    Social History   Tobacco Use  . Smoking status: Never Smoker  . Smokeless tobacco: Never Used  Substance Use Topics  . Alcohol use: Yes    Alcohol/week: 0.0 oz    Comment: occasional  . Drug use: No   ROS See pertinent in HPI  Blood pressure (!) 168/103, pulse (!) 112, weight 219 lb 8 oz (99.6 kg), last menstrual period 10/29/2017. GENERAL: Well-developed, well-nourished female in no acute distress.  Neuro: alert and oriented x 3   10/2017 Ultrasound  CLINICAL DATA:  Enlarged uterus on exam  EXAM: TRANSABDOMINAL AND TRANSVAGINAL ULTRASOUND OF PELVIS  TECHNIQUE: Both transabdominal and transvaginal ultrasound examinations of the pelvis were performed. Transabdominal technique was performed for global imaging of the pelvis including uterus, ovaries, adnexal regions, and pelvic cul-de-sac. It was necessary to proceed with endovaginal exam following the transabdominal exam to visualize the endometrium and ovaries.  COMPARISON:  None  FINDINGS: Uterus  Measurements: 17.6 x 2.7 x 14.2 cm. Heterogeneous echotexture throughout the uterus. Large right fibroid measures 10.3 x 8.0 x 6.9 cm.  Endometrium  Thickness: Not visualized due to the large fibroid.  Right ovary  Measurements: Not visualized.  Left ovary  Measurements: 2.6 x 1.2 x 1.6 cm. Normal appearance/no adnexal mass.  Other findings  No abnormal free fluid.  IMPRESSION: Enlarged uterus with 10.3 cm right uterine  fibroid.   Electronically Signed   By: Rolm Baptise M.D.   On: 10/31/2017 12:27  A/P 52 yo with asymptomatic fibroid uterus - Ultrasound results reviewed - Patient is currently asymptomatic and is not interested in any intervention - Signs/symptons reviewed - Patient reports normal mammogram in the past 12 months (records requested) - patient with hypertension. Patient advised to follow up with PCP

## 2018-11-04 ENCOUNTER — Other Ambulatory Visit: Payer: Self-pay | Admitting: *Deleted

## 2018-11-04 DIAGNOSIS — Z3041 Encounter for surveillance of contraceptive pills: Secondary | ICD-10-CM

## 2018-11-04 MED ORDER — NORETHINDRONE 0.35 MG PO TABS: 1.0000 | ORAL_TABLET | Freq: Every day | ORAL | 2 refills | Status: DC

## 2018-11-04 NOTE — Progress Notes (Signed)
Refill request on BC pills. Pt last seen in office 09/2017, refill sent for 3 months, per protocol, with appointment needs noted.

## 2019-01-25 ENCOUNTER — Other Ambulatory Visit: Payer: Self-pay | Admitting: Obstetrics

## 2019-01-25 DIAGNOSIS — Z3041 Encounter for surveillance of contraceptive pills: Secondary | ICD-10-CM

## 2019-09-01 ENCOUNTER — Ambulatory Visit: Payer: Medicare HMO | Admitting: Obstetrics & Gynecology

## 2019-09-12 ENCOUNTER — Ambulatory Visit: Payer: Medicare HMO | Admitting: Family Medicine

## 2019-09-23 ENCOUNTER — Telehealth: Payer: Self-pay | Admitting: Family Medicine
